# Patient Record
Sex: Female | Born: 1965 | Race: White | Hispanic: No | Marital: Married | Smoking: Former smoker
Health system: Southern US, Community
[De-identification: ages and names within clinical notes are randomized; demographics above are authoritative.]

## PROBLEM LIST (undated history)

## (undated) DIAGNOSIS — M1990 Unspecified osteoarthritis, unspecified site: Secondary | ICD-10-CM

## (undated) HISTORY — PX: CHOLECYSTECTOMY: SHX55

---

## 2012-02-14 ENCOUNTER — Emergency Department (HOSPITAL_COMMUNITY): Payer: BC Managed Care – PPO

## 2012-02-14 ENCOUNTER — Encounter (HOSPITAL_COMMUNITY): Payer: Self-pay | Admitting: Emergency Medicine

## 2012-02-14 ENCOUNTER — Inpatient Hospital Stay (HOSPITAL_COMMUNITY)
Admission: EM | Admit: 2012-02-14 | Discharge: 2012-02-17 | DRG: 813 | Disposition: A | Discharge status: Home / Self Care | Payer: BC Managed Care – PPO | Attending: Internal Medicine | Admitting: Internal Medicine

## 2012-02-14 DIAGNOSIS — E559 Vitamin D deficiency, unspecified: Secondary | ICD-10-CM | POA: Diagnosis present

## 2012-02-14 DIAGNOSIS — E860 Dehydration: Secondary | ICD-10-CM

## 2012-02-14 DIAGNOSIS — E871 Hypo-osmolality and hyponatremia: Secondary | ICD-10-CM | POA: Diagnosis present

## 2012-02-14 DIAGNOSIS — R197 Diarrhea, unspecified: Secondary | ICD-10-CM | POA: Diagnosis present

## 2012-02-14 DIAGNOSIS — R112 Nausea with vomiting, unspecified: Secondary | ICD-10-CM

## 2012-02-14 DIAGNOSIS — E876 Hypokalemia: Secondary | ICD-10-CM | POA: Diagnosis present

## 2012-02-14 DIAGNOSIS — K219 Gastro-esophageal reflux disease without esophagitis: Secondary | ICD-10-CM | POA: Diagnosis present

## 2012-02-14 DIAGNOSIS — IMO0002 Reserved for concepts with insufficient information to code with codable children: Secondary | ICD-10-CM

## 2012-02-14 DIAGNOSIS — M069 Rheumatoid arthritis, unspecified: Secondary | ICD-10-CM | POA: Diagnosis present

## 2012-02-14 DIAGNOSIS — Z79899 Other long term (current) drug therapy: Secondary | ICD-10-CM

## 2012-02-14 DIAGNOSIS — A088 Other specified intestinal infections: Principal | ICD-10-CM | POA: Diagnosis present

## 2012-02-14 DIAGNOSIS — K529 Noninfective gastroenteritis and colitis, unspecified: Secondary | ICD-10-CM | POA: Diagnosis present

## 2012-02-14 DIAGNOSIS — N390 Urinary tract infection, site not specified: Secondary | ICD-10-CM | POA: Diagnosis present

## 2012-02-14 HISTORY — DX: Unspecified osteoarthritis, unspecified site: M19.90

## 2012-02-14 LAB — LACTIC ACID, PLASMA: Lactic Acid, Venous: 1.1 mmol/L (ref 0.5–2.2)

## 2012-02-14 LAB — COMPREHENSIVE METABOLIC PANEL
ALT: 21 U/L (ref 0–35)
AST: 31 U/L (ref 0–37)
CO2: 22 mEq/L (ref 19–32)
Chloride: 94 mEq/L — ABNORMAL LOW (ref 96–112)
GFR calc non Af Amer: >90 mL/min (ref >90)
Potassium: 3.1 mEq/L — ABNORMAL LOW (ref 3.5–5.1)
Sodium: 134 mEq/L — ABNORMAL LOW (ref 135–145)
Total Bilirubin: 0.6 mg/dL (ref 0.3–1.2)

## 2012-02-14 LAB — CBC WITH DIFFERENTIAL/PLATELET
Basophils Absolute: 0.1 10*3/uL (ref 0.0–0.1)
HCT: 47.9 % — ABNORMAL HIGH (ref 36.0–46.0)
Lymphocytes Relative: 15 % (ref 12–46)
Neutro Abs: 4.3 10*3/uL (ref 1.7–7.7)
Neutrophils Relative %: 65 % (ref 43–77)
Platelets: 205 10*3/uL (ref 150–400)
RDW: 14.2 % (ref 11.5–15.5)
WBC: 6.6 10*3/uL (ref 4.0–10.5)

## 2012-02-14 LAB — URINALYSIS, ROUTINE W REFLEX MICROSCOPIC
Ketones, ur: >80 mg/dL — AB
Leukocytes, UA: NEGATIVE
Nitrite: NEGATIVE
Specific Gravity, Urine: >1.03 — ABNORMAL HIGH (ref 1.005–1.030)
Urobilinogen, UA: 0.2 mg/dL (ref 0.0–1.0)
pH: 6 (ref 5.0–8.0)

## 2012-02-14 LAB — URINE MICROSCOPIC-ADD ON

## 2012-02-14 LAB — CLOSTRIDIUM DIFFICILE BY PCR: Toxigenic C. Difficile by PCR: NEGATIVE

## 2012-02-14 MED ORDER — METRONIDAZOLE IN NACL 5-0.79 MG/ML-% IV SOLN
500.0000 mg | Freq: Three times a day (TID) | INTRAVENOUS | Status: DC
  Administered 2012-02-15 (×2): 500 mg via INTRAVENOUS
  Filled 2012-02-14 (×3): qty 100

## 2012-02-14 MED ORDER — POTASSIUM CHLORIDE 10 MEQ/100ML IV SOLN
10.0000 meq | Freq: Once | INTRAVENOUS | Status: AC
  Administered 2012-02-14: 10 meq via INTRAVENOUS
  Filled 2012-02-14: qty 100

## 2012-02-14 MED ORDER — METRONIDAZOLE IN NACL 5-0.79 MG/ML-% IV SOLN
INTRAVENOUS | Status: AC
  Filled 2012-02-14: qty 100

## 2012-02-14 MED ORDER — POTASSIUM CHLORIDE 10 MEQ/100ML IV SOLN
10.0000 meq | INTRAVENOUS | Status: AC
  Administered 2012-02-15 (×2): 10 meq via INTRAVENOUS
  Filled 2012-02-14 (×2): qty 100

## 2012-02-14 MED ORDER — FAMOTIDINE IN NACL 20-0.9 MG/50ML-% IV SOLN
20.0000 mg | Freq: Once | INTRAVENOUS | Status: AC
  Administered 2012-02-14: 20 mg via INTRAVENOUS
  Filled 2012-02-14: qty 50

## 2012-02-14 MED ORDER — IOHEXOL 300 MG/ML  SOLN
100.0000 mL | Freq: Once | INTRAMUSCULAR | Status: AC | PRN
  Administered 2012-02-14: 100 mL via INTRAVENOUS

## 2012-02-14 MED ORDER — SODIUM CHLORIDE 0.9 % IV SOLN
INTRAVENOUS | Status: DC
  Administered 2012-02-14: 20:00:00 via INTRAVENOUS

## 2012-02-14 MED ORDER — ONDANSETRON HCL 4 MG/2ML IJ SOLN
4.0000 mg | INTRAMUSCULAR | Status: AC | PRN
  Administered 2012-02-14 (×2): 4 mg via INTRAVENOUS
  Filled 2012-02-14 (×2): qty 2

## 2012-02-14 MED ORDER — SODIUM CHLORIDE 0.9 % IV BOLUS (SEPSIS)
1000.0000 mL | Freq: Once | INTRAVENOUS | Status: AC
  Administered 2012-02-14: 1000 mL via INTRAVENOUS

## 2012-02-14 MED ORDER — CIPROFLOXACIN IN D5W 400 MG/200ML IV SOLN
400.0000 mg | Freq: Two times a day (BID) | INTRAVENOUS | Status: DC
  Administered 2012-02-14: 400 mg via INTRAVENOUS
  Filled 2012-02-14 (×2): qty 200

## 2012-02-14 NOTE — ED Provider Notes (Signed)
History     CSN: [id number]  Arrival date & time 02/14/12  1709   First MD Initiated Contact with Patient 02/14/12 1722      Chief Complaint  Patient presents with  . Weakness  . Hypotension     HPI Pt was seen at 1735.  Per pt and spouse, c/o gradual onset and persistence of multiple intermittent episodes of N/V/D for the past 4-5 days.  Has been associated with generalized abd "pain."  State Tmax was "100.5" at home. Pt states she had a brief syncopal episode after using the bathroom at home 4 days ago. Pt was eval in [facility] ED after syncopal episode, told her "BP was low" and she "had a GI virus" that would "get better in a day or 2."  Pt states her symptoms continue.   Pt endorses multiple sick contacts at pt's work with same symptoms.  Denies black or blood in stools or emesis, no back pain, no rash, no fevers, no CP/SOB.    Past Medical History  Diagnosis Date  . Arthritis     Past Surgical History  Procedure Date  . Cesarean section     History  Substance Use Topics  . Smoking status: Not on file  . Smokeless tobacco: Not on file  . Alcohol Use: No      Review of Systems ROS: Statement: All systems negative except as marked or noted in the HPI; Constitutional: +chills, decreased PO intake. ; ; Eyes: Negative for eye pain, redness and discharge. ; ; ENMT: Negative for ear pain, hoarseness, nasal congestion, sinus pressure and sore throat. ; ; Cardiovascular: Negative for chest pain, palpitations, diaphoresis, dyspnea and peripheral edema. ; ; Respiratory: Negative for cough, wheezing and stridor. ; ; Gastrointestinal: +N/V/D, abd pain. Negative for blood in stool, hematemesis, jaundice and rectal bleeding. . ; ; Genitourinary: Negative for dysuria, flank pain and hematuria. ; ; Musculoskeletal: Negative for back pain and neck pain. Negative for swelling and trauma.; ; Skin: Negative for pruritus, rash, abrasions, blisters, bruising and skin lesion.; ; Neuro: Negative  for headache, lightheadedness and neck stiffness. Negative for altered mental status, extremity weakness, paresthesias, involuntary movement, seizure and +generalized weakness, syncope.       Allergies  Amoxicillin; Ceclor; and Codeine  Home Medications   Current Outpatient Rx  Name  Route  Sig  Dispense  Refill  . VITAMIN D PO   Oral   Take 1 tablet by mouth daily.         Marland Kitchen FOLIC ACID 1 MG PO TABS   Oral   Take 1 mg by mouth daily.         Marland Kitchen METHOTREXATE 2.5 MG PO TABS   Oral   Take 12.5 mg by mouth once a week. Caution:Chemotherapy. Protect from light.         Marland Kitchen METRONIDAZOLE 500 MG PO TABS   Oral   Take 500 mg by mouth 2 (two) times daily.         Marland Kitchen OMEPRAZOLE 40 MG PO CPDR   Oral   Take 40 mg by mouth daily.         Marland Kitchen PREDNISONE 10 MG PO TABS   Oral   Take 10 mg by mouth every morning.         Marland Kitchen VITAMIN D (ERGOCALCIFEROL) 50000 UNITS PO CAPS   Oral   Take 50,000 Units by mouth every 7 (seven) days.           BP  121/76  Pulse 108  Temp 98.1 F (36.7 C) (Oral)  Resp 23  Ht 5\' 3"  (1.6 m)  Wt 180 lb (81.647 kg)  BMI 31.89 kg/m2  SpO2 99%  Physical Exam 1740: Physical examination:  Nursing notes reviewed; Vital signs and O2 SAT reviewed;  Constitutional: Well developed, Well nourished, In no acute distress; Head:  Normocephalic, atraumatic; Eyes: EOMI, PERRL, No scleral icterus; ENMT: Mouth and pharynx normal, Mucous membranes dry; Neck: Supple, Full range of motion, No lymphadenopathy; Cardiovascular: Regular rate and rhythm, No gallop; Respiratory: Breath sounds clear & equal bilaterally, No rales, rhonchi, wheezes.  Speaking full sentences with ease, Normal respiratory effort/excursion; Chest: Nontender, Movement normal; Abdomen: Soft, +diffusely tender to palp, esp mid-epigastric area. Nondistended, Normal bowel sounds;; Extremities: Pulses normal, No tenderness, No edema, No calf edema or asymmetry.; Neuro: AA&Ox3, Major CN grossly intact.   Speech clear. No gross focal motor or sensory deficits in extremities.; Skin: Color normal, Warm, Dry.   ED Course  Procedures    MDM  MDM Reviewed: previous chart, nursing note and vitals Interpretation: labs, ECG, x-ray and CT scan    Date: 02/14/2012  Rate: 89  Rhythm: normal sinus rhythm  QRS Axis: normal  Intervals: normal  ST/T Wave abnormalities: nonspecific ST/T changes inferior leads  Conduction Disutrbances:none  Narrative Interpretation:   Old EKG Reviewed: none available.    Results for orders placed during the hospital encounter of 02/14/12  LACTIC ACID, PLASMA      Component Value Range   Lactic Acid, Venous 1.1  0.5 - 2.2 mmol/L  CBC WITH DIFFERENTIAL      Component Value Range   WBC 6.6  4.0 - 10.5 K/uL   RBC 5.69 (*) 3.87 - 5.11 MIL/uL   Hemoglobin 16.8 (*) 12.0 - 15.0 g/dL   HCT 40.3 (*) 47.4 - 25.9 %   MCV 84.2  78.0 - 100.0 fL   MCH 29.5  26.0 - 34.0 pg   MCHC 35.1  30.0 - 36.0 g/dL   RDW 56.3  87.5 - 64.3 %   Platelets 205  150 - 400 K/uL   Neutrophils Relative 65  43 - 77 %   Neutro Abs 4.3  1.7 - 7.7 K/uL   Lymphocytes Relative 15  12 - 46 %   Lymphs Abs 1.0  0.7 - 4.0 K/uL   Monocytes Relative 15 (*) 3 - 12 %   Monocytes Absolute 1.0  0.1 - 1.0 K/uL   Eosinophils Relative 3  0 - 5 %   Eosinophils Absolute 0.2  0.0 - 0.7 K/uL   Basophils Relative 2 (*) 0 - 1 %   Basophils Absolute 0.1  0.0 - 0.1 K/uL  COMPREHENSIVE METABOLIC PANEL      Component Value Range   Sodium 134 (*) 135 - 145 mEq/L   Potassium 3.1 (*) 3.5 - 5.1 mEq/L   Chloride 94 (*) 96 - 112 mEq/L   CO2 22  19 - 32 mEq/L   Glucose, Bld 84  70 - 99 mg/dL   BUN 8  6 - 23 mg/dL   Creatinine, Ser 3.29  0.50 - 1.10 mg/dL   Calcium 9.3  8.4 - 51.8 mg/dL   Total Protein 7.1  6.0 - 8.3 g/dL   Albumin 3.4 (*) 3.5 - 5.2 g/dL   AST 31  0 - 37 U/L   ALT 21  0 - 35 U/L   Alkaline Phosphatase 53  39 - 117 U/L   Total Bilirubin 0.6  0.3 - 1.2 mg/dL   GFR calc non Af Amer >90  >90  mL/min   GFR calc Af Amer >90  >90 mL/min  LIPASE, BLOOD      Component Value Range   Lipase 79 (*) 11 - 59 U/L  URINALYSIS, ROUTINE W REFLEX MICROSCOPIC      Component Value Range   Color, Urine YELLOW  YELLOW   APPearance CLEAR  CLEAR   Specific Gravity, Urine >1.030 (*) 1.005 - 1.030   pH 6.0  5.0 - 8.0   Glucose, UA NEGATIVE  NEGATIVE mg/dL   Hgb urine dipstick SMALL (*) NEGATIVE   Bilirubin Urine SMALL (*) NEGATIVE   Ketones, ur >80 (*) NEGATIVE mg/dL   Protein, ur TRACE (*) NEGATIVE mg/dL   Urobilinogen, UA 0.2  0.0 - 1.0 mg/dL   Nitrite NEGATIVE  NEGATIVE   Leukocytes, UA NEGATIVE  NEGATIVE  PREGNANCY, URINE      Component Value Range   Preg Test, Ur NEGATIVE  NEGATIVE  TROPONIN I      Component Value Range   Troponin I <0.30  <0.30 ng/mL  URINE MICROSCOPIC-ADD ON      Component Value Range   Squamous Epithelial / LPF MANY (*) RARE   WBC, UA 11-20  <3 WBC/hpf   RBC / HPF 0-2  <3 RBC/hpf   Bacteria, UA MANY (*) RARE   Urine-Other MUCOUS PRESENT     Dg Chest 2 View 02/14/2012  *RADIOLOGY REPORT*  Clinical Data: Cough.  CHEST - 2 VIEW  Comparison: No priors.  Findings: Lung volumes are normal.  No consolidative airspace disease.  No pleural effusions.  No pneumothorax.  No pulmonary nodule or mass noted.  Pulmonary vasculature and the cardiomediastinal silhouette are within normal limits.  IMPRESSION: 1. No radiographic evidence of acute cardiopulmonary disease.   Original Report Authenticated By: Trudie Reed, M.D.    Ct Abdomen Pelvis W Contrast 02/14/2012  *RADIOLOGY REPORT*  Clinical Data: Nausea, vomiting and diarrhea.  CT ABDOMEN AND PELVIS WITH CONTRAST  Technique:  Multidetector CT imaging of the abdomen and pelvis was performed following the standard protocol during bolus administration of intravenous contrast.  Contrast: OMNIPAQUE IOHEXOL 300 MG/ML  SOLN  Comparison: No priors.  Findings:  Lung Bases: Small hiatal hernia.  Otherwise, unremarkable.   Abdomen/Pelvis:  The enhanced appearance of the liver, gallbladder, pancreas, spleen, bilateral adrenal glands and bilateral kidneys is unremarkable.  No ascites or pneumoperitoneum and no pathologic distension of small bowel.  No definite pathologic lymphadenopathy identified.  Uterus and bilateral ovaries are unremarkable in appearance.  Urinary bladder is nearly complete decompressed and unremarkable in appearance.  Musculoskeletal: There are no aggressive appearing lytic or blastic lesions noted in the visualized portions of the skeleton.  IMPRESSION: 1.  No acute abnormalities in the abdomen or pelvis to account for the patient's symptoms. 2.  Small hiatal hernia.   Original Report Authenticated By: Trudie Reed, M.D.       2020:  Pt with multiple diarrheal stools while in the ED. Fecal occult blood, cdiff and stool cultures obtained and sent to lab.  No N/V while in the ED.  Clinically appears dehydrated, labs hemeconcentrated.  Potassium repleted IV.  Udip contaminated, UC pending.  Dx and testing d/w pt and family.  Questions answered.  Verb understanding, agreeable to admit.  T/C to [facility] Dr. Phillips Odor, case discussed, including:  HPI, pertinent PM/SHx, VS/PE, dx testing, ED course and treatment:  Agreeable to observation admit, requests she will  come to ED for eval.        Laray Anger, DO 02/15/12 1629

## 2012-02-14 NOTE — ED Notes (Signed)
Pt c/o being weak and having low blood pressure since syncopal episode Friday. Pt has lost control of bowels and is vomiting intermittently.

## 2012-02-15 DIAGNOSIS — E876 Hypokalemia: Secondary | ICD-10-CM

## 2012-02-15 DIAGNOSIS — M069 Rheumatoid arthritis, unspecified: Secondary | ICD-10-CM

## 2012-02-15 DIAGNOSIS — E860 Dehydration: Secondary | ICD-10-CM

## 2012-02-15 DIAGNOSIS — R112 Nausea with vomiting, unspecified: Secondary | ICD-10-CM

## 2012-02-15 LAB — CBC
HCT: 37.9 % (ref 36.0–46.0)
Platelets: 187 10*3/uL (ref 150–400)
RDW: 14.3 % (ref 11.5–15.5)
WBC: 5.7 10*3/uL (ref 4.0–10.5)

## 2012-02-15 LAB — BASIC METABOLIC PANEL
Calcium: 7.8 mg/dL — ABNORMAL LOW (ref 8.4–10.5)
Chloride: 102 mEq/L (ref 96–112)
Creatinine, Ser: 0.72 mg/dL (ref 0.50–1.10)
GFR calc Af Amer: >90 mL/min (ref >90)
Sodium: 136 mEq/L (ref 135–145)

## 2012-02-15 MED ORDER — CIPROFLOXACIN IN D5W 400 MG/200ML IV SOLN
400.0000 mg | Freq: Two times a day (BID) | INTRAVENOUS | Status: DC
  Administered 2012-02-15 – 2012-02-16 (×3): 400 mg via INTRAVENOUS
  Filled 2012-02-15 (×5): qty 200

## 2012-02-15 MED ORDER — TRAMADOL HCL 50 MG PO TABS
50.0000 mg | ORAL_TABLET | Freq: Four times a day (QID) | ORAL | Status: DC | PRN
  Administered 2012-02-15: 50 mg via ORAL
  Filled 2012-02-15: qty 1

## 2012-02-15 MED ORDER — POTASSIUM CHLORIDE 10 MEQ/100ML IV SOLN
INTRAVENOUS | Status: AC
  Filled 2012-02-15: qty 100

## 2012-02-15 MED ORDER — MORPHINE SULFATE 2 MG/ML IJ SOLN
2.0000 mg | INTRAMUSCULAR | Status: DC | PRN
  Administered 2012-02-15: 2 mg via INTRAVENOUS
  Filled 2012-02-15: qty 1

## 2012-02-15 MED ORDER — ONDANSETRON HCL 4 MG/2ML IJ SOLN
INTRAMUSCULAR | Status: AC
  Administered 2012-02-16: 4 mg via INTRAVENOUS
  Filled 2012-02-15: qty 2

## 2012-02-15 MED ORDER — ONDANSETRON HCL 4 MG PO TABS: 4.0000 mg | ORAL_TABLET | Freq: Four times a day (QID) | ORAL | Status: DC | PRN

## 2012-02-15 MED ORDER — BIOTENE DRY MOUTH MT LIQD
15.0000 mL | Freq: Two times a day (BID) | OROMUCOSAL | Status: DC
  Administered 2012-02-15 – 2012-02-17 (×5): 15 mL via OROMUCOSAL

## 2012-02-15 MED ORDER — POTASSIUM CHLORIDE 10 MEQ/100ML IV SOLN
10.0000 meq | Freq: Once | INTRAVENOUS | Status: AC
  Administered 2012-02-15: 10 meq via INTRAVENOUS
  Filled 2012-02-15: qty 100

## 2012-02-15 MED ORDER — ONDANSETRON HCL 4 MG/2ML IJ SOLN
4.0000 mg | Freq: Four times a day (QID) | INTRAMUSCULAR | Status: DC | PRN
  Administered 2012-02-15 – 2012-02-16 (×2): 4 mg via INTRAVENOUS
  Filled 2012-02-15: qty 2

## 2012-02-15 MED ORDER — POTASSIUM CHLORIDE IN NACL 40-0.9 MEQ/L-% IV SOLN
INTRAVENOUS | Status: DC
  Administered 2012-02-15 – 2012-02-17 (×4): via INTRAVENOUS

## 2012-02-15 NOTE — H&P (Signed)
[facility] History and Physical  Sabrina Jacobson ZOX:[id number] DOB: 12/31/1965 DOA: 02/14/2012  Referring physician: Marice Potter PCP: No primary provider on file.  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 Her illness started 5 days ago in the morning with profuse vomiting and simultaneous watery diarrhea, it persisted throughout the day and she had a witnessed syncopal episode, "fell like a tree" per husband, had LOC for about 6 seconds, EMS was called and they took her to [facility] ED where she was given IV fluids and then sent home with some percocet (even though she was vomiting) for her rib pain from falling and told she would improve in 24 hours. Per patient the reported her lab work was all normal. Her symptoms however persisted -she has had no PO intake for the past 4-5 days, she feels hungry but has been unable to keep down bland food or fluids, her diarrhea also continues. She has had no know exposures to raw foods or eaten anything different than usual. Her son who is 17 and a type 1 diabetic had 2-3 days of diarrhea about a week and a half ago, her husband and daughter have not had any GI illness. No recent antibiotics. No inflammatory bowel disease or medical history, she thinks she may have had a fever when the symptoms started, no chills, no night sweats, no Chest pain or hematemesis. No significant cramping or abdominal pain associated with NVD.  In ED she is clinically dehydrated with hyponatremia, hemeconcentration, and high SG w/ketones. Her K is 3.1. CT of ABD was negative. Patient is being admitted for IV fuids and evaluation of persistent NVD.   Review of Systems  Constitutional: Positive for fever, weight loss and malaise/fatigue.  Negative for chills and diaphoresis.  HENT: Negative for congestion and sore throat.   Eyes: Negative.   Respiratory: Negative.   Cardiovascular: Negative.   Gastrointestinal: Positive for nausea, vomiting and diarrhea. Negative for heartburn, abdominal pain, constipation, blood in stool and melena.  Genitourinary: Negative.   Musculoskeletal: Positive for joint pain.  Skin: Negative for itching and rash.  Neurological: Positive for dizziness, loss of consciousness and weakness. Negative for headaches.  Endo/Heme/Allergies: Negative.   Psychiatric/Behavioral: Negative for depression and substance abuse. The patient is not nervous/anxious.   All other systems reviewed and are negative.   Past Medical History  Diagnosis Date  . Arthritis    Past Surgical History  Procedure Date  . Cesarean section    Social History:  does not have a smoking history on file. She does not have any smokeless tobacco history on file. She reports that she does not drink alcohol or use illicit drugs. Lives in [place], [place] with her husband and her teenage son and daughter. She is a high Chief Strategy Officer.   Allergies  Allergen Reactions  . Amoxicillin Hives and Other (See Comments)    Reaction unknown: Possible hives to either this medication or codeine  . Ceclor (Cefaclor) Hives    Possible Reaction of hives   . Codeine Nausea And Vomiting    No family history on file. Mother with alzheimer's disease, father deceased of stroke.  Prior to Admission medications   Medication Sig Start Date End Date Taking? Authorizing Provider  Cholecalciferol (VITAMIN D PO) Take 1 tablet  by mouth daily.   Yes Historical Provider, MD  folic acid (FOLVITE) 1 MG tablet Take 1 mg by mouth daily.   Yes Historical Provider, MD  methotrexate (RHEUMATREX) 2.5 MG tablet Take 12.5 mg by mouth once a week. Caution:Chemotherapy. Protect from light.   Yes Historical Provider, MD  metroNIDAZOLE (FLAGYL) 500 MG tablet Take 500  mg by mouth 2 (two) times daily.   Yes Historical Provider, MD  omeprazole (PRILOSEC) 40 MG capsule Take 40 mg by mouth daily.   Yes Historical Provider, MD  predniSONE (DELTASONE) 10 MG tablet Take 10 mg by mouth every morning.   Yes Historical Provider, MD  Vitamin D, Ergocalciferol, (DRISDOL) 50000 UNITS CAPS Take 50,000 Units by mouth every 7 (seven) days.   Yes Historical Provider, MD   Physical Exam: Filed Vitals:   02/14/12 2000 02/14/12 2100 02/14/12 2200 02/14/12 2237  BP: 111/71 108/70 113/69 119/81  Pulse: 91  82 89  Temp:    98.2 F (36.8 C)  TempSrc:    Oral  Resp: 17 15 16 16   Height:    5\' 3"  (1.6 m)  Weight:      SpO2: 96%  98% 94%     General:  Pale, sick appearing woman, dark circles under her eyes, looks exhausted  Eyes: non icteric  ENT: very dry MM  Neck: normal  Cardiovascular: Tachy, regular, no mrg  Respiratory: CTAB  Abdomen: soft, non-tender, hyperactive BS  Skin: no rashes, dry, loss of turgor  Musculoskeletal: no active joint swelling or deformity  Psychiatric: appropriate  Neurologic: normal, non-focal  Labs on Admission:  Basic Metabolic Panel:  Lab 02/14/12 4098  NA 134*  K 3.1*  CL 94*  CO2 22  GLUCOSE 84  BUN 8  CREATININE 0.63  CALCIUM 9.3  MG --  PHOS --   Liver Function Tests:  Lab 02/14/12 1730  AST 31  ALT 21  ALKPHOS 53  BILITOT 0.6  PROT 7.1  ALBUMIN 3.4*    Lab 02/14/12 1730  LIPASE 79*  AMYLASE --   CBC:  Lab 02/14/12 1730  WBC 6.6  NEUTROABS 4.3  HGB 16.8*  HCT 47.9*  MCV 84.2  PLT 205   Cardiac Enzymes:  Lab 02/14/12 1730  CKTOTAL --  CKMB --  CKMBINDEX --  TROPONINI <0.30    Radiological Exams on Admission: Dg Chest 2 View  02/14/2012  *RADIOLOGY REPORT*  Clinical Data: Cough.  CHEST - 2 VIEW  Comparison: No priors.  Findings: Lung volumes are normal.  No consolidative airspace disease.  No pleural effusions.  No pneumothorax.  No pulmonary nodule or mass noted.  Pulmonary  vasculature and the cardiomediastinal silhouette are within normal limits.  IMPRESSION: 1. No radiographic evidence of acute cardiopulmonary disease.   Original Report Authenticated By: Trudie Reed, M.D.    Ct Abdomen Pelvis W Contrast  02/14/2012  *RADIOLOGY REPORT*  Clinical Data: Nausea, vomiting and diarrhea.  CT ABDOMEN AND PELVIS WITH CONTRAST  Technique:  Multidetector CT imaging of the abdomen and pelvis was performed following the standard protocol during bolus administration of intravenous contrast.  Contrast: OMNIPAQUE IOHEXOL 300 MG/ML  SOLN  Comparison: No priors.  Findings:  Lung Bases: Small hiatal hernia.  Otherwise, unremarkable.  Abdomen/Pelvis:  The enhanced appearance of the liver, gallbladder, pancreas, spleen, bilateral adrenal glands and bilateral kidneys is unremarkable.  No ascites or pneumoperitoneum and no pathologic distension of small bowel.  No definite pathologic lymphadenopathy identified.  Uterus and bilateral ovaries are  unremarkable in appearance.  Urinary bladder is nearly complete decompressed and unremarkable in appearance.  Musculoskeletal: There are no aggressive appearing lytic or blastic lesions noted in the visualized portions of the skeleton.  IMPRESSION: 1.  No acute abnormalities in the abdomen or pelvis to account for the patient's symptoms. 2.  Small hiatal hernia.   Original Report Authenticated By: Trudie Reed, M.D.     EKG: Independently reviewed. NSR.Sinus Tachycardia.  Assessment/Plan Principal Problem:  *Dehydration Active Problems:  Nausea & vomiting  Diarrhea  Rheumatoid arthritis  Vitamin D deficiency  GERD (gastroesophageal reflux disease)  UTI (lower urinary tract infection)    Ms. Liggett comes in after 5 days of a persistent acute gastrointestinal illness with nausea, vomiting and diarrhea that was associated with one syncopal episode felt to be from acute dehydration. She is immunocompromised on Methotrexate and  Prednisone for her RA. She has never had any GI problems or other significant infections or side effects from her RA meds. He story is most consistent with an infectious etiology-probably viral like norovirus but because she is immunocompromised she probably having a long and protracted course fighting off this infection. 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 Fortunately her CT is negative, her lab work suggests dehydration, her urine shows 11-20 RBC so she may have a mild UTI. Her lipase is also very mildly elevated probably from duodenal irritation.   Aggressive IV hydration  She is risk for hypotension from Adrenal Insufficiency since she has been on long term steroids, if needed will start stress dose steroids IV  Stool has been sent for Culture, C. Diff, and for norovirus PCR  Empiric Cipro/Flagy for UTI and until cultures and PCRs return.  K repletion  Repeat labs in AM  Provide supportive care inpatient until symptoms show resolution.  Follow up urine cx  Discontinue Methotrexate indefinitely until she can be seen by her rheumatologist  Code Status: Full Code Family Communication: Discussed plan with patient and her husband Disposition Plan: Home when medically stable   Time spent: 70 minutes  [facility] Pager [phone]  If 7PM-7AM, please contact night-coverage [email or web address] Password [email or web address] 02/15/2012, 1:32 AM

## 2012-02-15 NOTE — Progress Notes (Signed)
UR Chart Review Completed  

## 2012-02-15 NOTE — Care Management Note (Unsigned)
    Page 1 of 1   02/15/2012     11:32:45 AM   CARE MANAGEMENT NOTE 02/15/2012  Patient:  Sabrina Jacobson, Sabrina Jacobson   Account Number:  [id number]  Date Initiated:  02/15/2012  Documentation initiated by:  Rosemary Holms  Subjective/Objective Assessment:   pt admitted from home where she lives with spouse. To return home     Action/Plan:   No HH needs anticipated   Anticipated DC Date:  02/17/2012   Anticipated DC Plan:  HOME/SELF CARE         Choice offered to / List presented to:             Status of service:  In process, will continue to follow Medicare Important Message given?   (If response is "NO", the following Medicare IM given date fields will be blank) Date Medicare IM given:   Date Additional Medicare IM given:    Discharge Disposition:    Per UR Regulation:    If discussed at Long Length of Stay Meetings, dates discussed:    Comments:  02/15/12 Rosemary Holms RN BSN CM

## 2012-02-15 NOTE — Progress Notes (Signed)
02/15/12 1854 Patient c/o pain to her right side this evening, thinks possibly due to her fall prior to admission, stated "think i may have pulled something". Patient had morphine PRN for pain, expressed concerns since she has never taken this. Notified Dr Karilyn Cota, orders received for tramadol 50 mg po every 6 hours PRN for pain. Nursing to monitor. Earnstine Regal, RN

## 2012-02-15 NOTE — Progress Notes (Signed)
Subjective: This lady feels improved today and feel that she has some strength now. She still continues to have watery diarrhea. No rectal bleeding. She is able to tolerate oral intake without vomiting. No abdominal pain.           Physical Exam: Blood pressure 99/62, pulse 74, temperature 99 F (37.2 C), temperature source Oral, resp. rate 16, height 5\' 3"  (1.6 m), weight 85.276 kg (188 lb), SpO2 93.00%. She looks systemically well. She is not toxic or septic. Her abdomen is soft and nontender. Bowel sounds are heard. Heart sounds are present and normal. Lung fields are clear. She is alert and orientated.   Investigations:  Recent Results (from the past 240 hour(s))  CLOSTRIDIUM DIFFICILE BY PCR     Status: Normal   Collection Time   02/14/12  5:52 PM      Component Value Range Status Comment   C difficile by pcr NEGATIVE  NEGATIVE Final      Basic Metabolic Panel:  Basename 02/15/12 0453 02/14/12 1730  NA 136 134*  K 3.1* 3.1*  CL 102 94*  CO2 22 22  GLUCOSE 82 84  BUN 5* 8  CREATININE 0.72 0.63  CALCIUM 7.8* 9.3  MG -- --  PHOS -- --   Liver Function Tests:  [facility] 02/14/12 1730  AST 31  ALT 21  ALKPHOS 53  BILITOT 0.6  PROT 7.1  ALBUMIN 3.4*     CBC:  Basename 02/15/12 0453 02/14/12 1730  WBC 5.7 6.6  NEUTROABS -- 4.3  HGB 12.8 16.8*  HCT 37.9 47.9*  MCV 85.2 84.2  PLT 187 205    Dg Chest 2 View  02/14/2012  *RADIOLOGY REPORT*  Clinical Data: Cough.  CHEST - 2 VIEW  Comparison: No priors.  Findings: Lung volumes are normal.  No consolidative airspace disease.  No pleural effusions.  No pneumothorax.  No pulmonary nodule or mass noted.  Pulmonary vasculature and the cardiomediastinal silhouette are within normal limits.  IMPRESSION: 1. No radiographic evidence of acute cardiopulmonary disease.   Original Report Authenticated By: Trudie Reed, M.D.    Ct Abdomen Pelvis W Contrast  02/14/2012  *RADIOLOGY REPORT*  Clinical Data:  Nausea, vomiting and diarrhea.  CT ABDOMEN AND PELVIS WITH CONTRAST  Technique:  Multidetector CT imaging of the abdomen and pelvis was performed following the standard protocol during bolus administration of intravenous contrast.  Contrast: OMNIPAQUE IOHEXOL 300 MG/ML  SOLN  Comparison: No priors.  Findings:  Lung Bases: Small hiatal hernia.  Otherwise, unremarkable.  Abdomen/Pelvis:  The enhanced appearance of the liver, gallbladder, pancreas, spleen, bilateral adrenal glands and bilateral kidneys is unremarkable.  No ascites or pneumoperitoneum and no pathologic distension of small bowel.  No definite pathologic lymphadenopathy identified.  Uterus and bilateral ovaries are unremarkable in appearance.  Urinary bladder is nearly complete decompressed and unremarkable in appearance.  Musculoskeletal: There are no aggressive appearing lytic or blastic lesions noted in the visualized portions of the skeleton.  IMPRESSION: 1.  No acute abnormalities in the abdomen or pelvis to account for the patient's symptoms. 2.  Small hiatal hernia.   Original Report Authenticated By: Trudie Reed, M.D.       Medications: I have reviewed the patient's current medications.  Impression: 1. Viral gastroenteritis. CT scan of the abdomen and not indicative of anything more sinister. 2. Rheumatoid arthritis on methotrexate. 3. Probable UTI. 4. Hypokalemia.     Plan: 1. Discontinue metronidazole as I do not really  think she has a bacterial gastroenteritis or colitis. 2. Continue with ciprofloxacin for probable UTI. 3. Replete potassium. 4. Reduce IV fluids and encourage oral intake. 5. Home when medically stable.     LOS: 1 day   Wilson Singer Pager [phone]  02/15/2012, 8:57 AM

## 2012-02-16 ENCOUNTER — Encounter (HOSPITAL_COMMUNITY): Payer: Self-pay | Admitting: Internal Medicine

## 2012-02-16 DIAGNOSIS — E876 Hypokalemia: Secondary | ICD-10-CM | POA: Diagnosis present

## 2012-02-16 DIAGNOSIS — K5289 Other specified noninfective gastroenteritis and colitis: Secondary | ICD-10-CM

## 2012-02-16 DIAGNOSIS — K529 Noninfective gastroenteritis and colitis, unspecified: Secondary | ICD-10-CM | POA: Diagnosis present

## 2012-02-16 LAB — COMPREHENSIVE METABOLIC PANEL
ALT: 18 U/L (ref 0–35)
AST: 25 U/L (ref 0–37)
CO2: 20 mEq/L (ref 19–32)
Chloride: 105 mEq/L (ref 96–112)
GFR calc non Af Amer: >90 mL/min (ref >90)
Sodium: 134 mEq/L — ABNORMAL LOW (ref 135–145)
Total Bilirubin: 0.3 mg/dL (ref 0.3–1.2)

## 2012-02-16 LAB — URINE CULTURE: Colony Count: >=100000

## 2012-02-16 LAB — CBC
Platelets: 201 10*3/uL (ref 150–400)
RBC: 4.6 MIL/uL (ref 3.87–5.11)
WBC: 5.8 10*3/uL (ref 4.0–10.5)

## 2012-02-16 MED ORDER — FOLIC ACID 1 MG PO TABS
1.0000 mg | ORAL_TABLET | Freq: Every day | ORAL | Status: DC
  Administered 2012-02-16 – 2012-02-17 (×2): 1 mg via ORAL
  Filled 2012-02-16 (×2): qty 1

## 2012-02-16 MED ORDER — LOPERAMIDE HCL 2 MG PO CAPS
4.0000 mg | ORAL_CAPSULE | Freq: Once | ORAL | Status: AC
  Administered 2012-02-16: 4 mg via ORAL
  Filled 2012-02-16: qty 2
  Filled 2012-02-16: qty 1

## 2012-02-16 MED ORDER — LOPERAMIDE HCL 2 MG PO CAPS
2.0000 mg | ORAL_CAPSULE | ORAL | Status: DC | PRN
  Administered 2012-02-17: 2 mg via ORAL
  Filled 2012-02-16: qty 1

## 2012-02-16 MED ORDER — PREDNISONE 10 MG PO TABS
10.0000 mg | ORAL_TABLET | Freq: Every morning | ORAL | Status: DC
  Administered 2012-02-16 – 2012-02-17 (×2): 10 mg via ORAL
  Filled 2012-02-16 (×2): qty 1

## 2012-02-16 NOTE — Progress Notes (Signed)
Chart reviewed.  Subjective: Some nausea. No vomiting. Still with watery stools, though less frequent. No abdominal pain. No dysuria. Has been tolerating liquid diet. Trying a solid diet for lunch. Objective: Vital signs in last 24 hours: Filed Vitals:   02/15/12 0403 02/15/12 1407 02/15/12 2026 02/16/12 0551  BP: 99/62 107/69 116/72 108/71  Pulse: 74 77 86 79  Temp: 99 F (37.2 C) 98.9 F (37.2 C) 100.6 F (38.1 C) 98.2 F (36.8 C)  TempSrc: Oral  Axillary   Resp: 16 16 16 16   Height:      Weight: 85.276 kg (188 lb)     SpO2: 93% 95% 96% 98%   Weight change:   Intake/Output Summary (Last 24 hours) at 02/16/12 1249 Last data filed at 02/16/12 7829  Gross per 24 hour  Intake 2886.25 ml  Output      0 ml  Net 2886.25 ml   General: Eating lunch tray. Nontoxic appearing. HEENT: No thrush. Moist mucous membranes. Lungs clear to auscultation bilaterally without wheezes rhonchi or rales Cardiovascular regular rate rhythm without murmurs gallops rubs Abdomen soft nontender nondistended Extremities no clubbing cyanosis or edema  Lab Results: Basic Metabolic Panel:  Lab 02/16/12 5621 02/15/12 0453  NA 134* 136  K 3.4* 3.1*  CL 105 102  CO2 20 22  GLUCOSE 91 82  BUN <3* 5*  CREATININE 0.65 0.72  CALCIUM 8.0* 7.8*  MG 1.5 --  PHOS -- --   Liver Function Tests:  Lab 02/16/12 0511 02/14/12 1730  AST 25 31  ALT 18 21  ALKPHOS 37* 53  BILITOT 0.3 0.6  PROT 5.1* 7.1  ALBUMIN 2.5* 3.4*    Lab 02/14/12 1730  LIPASE 79*  AMYLASE --   No results found for this basename: AMMONIA:2 in the last 168 hours CBC:  Lab 02/16/12 0511 02/15/12 0453 02/14/12 1730  WBC 5.8 5.7 --  NEUTROABS -- -- 4.3  HGB 13.3 12.8 --  HCT 39.2 37.9 --  MCV 85.2 85.2 --  PLT 201 187 --   Cardiac Enzymes:  Lab 02/14/12 1730  CKTOTAL --  CKMB --  CKMBINDEX --  TROPONINI <0.30   Urinalysis:  Lab 02/14/12 1751  COLORURINE YELLOW  LABSPEC >1.030*  PHURINE 6.0  GLUCOSEU NEGATIVE    HGBUR SMALL*  BILIRUBINUR SMALL*  KETONESUR >80*  PROTEINUR TRACE*  UROBILINOGEN 0.2  NITRITE NEGATIVE  LEUKOCYTESUR NEGATIVE    Micro Results: Recent Results (from the past 240 hour(s))  URINE CULTURE     Status: Normal (Preliminary result)   Collection Time   02/14/12  5:51 PM      Component Value Range Status Comment   Specimen Description URINE, CLEAN CATCH   Final    Special Requests NONE   Final    Culture  Setup Time 02/14/2012 18:40   Final    Colony Count >=100,000 COLONIES/ML   Final    Culture ESCHERICHIA COLI   Final    Report Status PENDING   Incomplete   CLOSTRIDIUM DIFFICILE BY PCR     Status: Normal   Collection Time   02/14/12  5:52 PM      Component Value Range Status Comment   C difficile by pcr NEGATIVE  NEGATIVE Final    Studies/Results: Dg Chest 2 View  02/14/2012  *RADIOLOGY REPORT*  Clinical Data: Cough.  CHEST - 2 VIEW  Comparison: No priors.  Findings: Lung volumes are normal.  No consolidative airspace disease.  No pleural effusions.  No pneumothorax.  No pulmonary nodule or mass noted.  Pulmonary vasculature and the cardiomediastinal silhouette are within normal limits.  IMPRESSION: 1. No radiographic evidence of acute cardiopulmonary disease.   Original Report Authenticated By: Trudie Reed, M.D.    Ct Abdomen Pelvis W Contrast  02/14/2012  *RADIOLOGY REPORT*  Clinical Data: Nausea, vomiting and diarrhea.  CT ABDOMEN AND PELVIS WITH CONTRAST  Technique:  Multidetector CT imaging of the abdomen and pelvis was performed following the standard protocol during bolus administration of intravenous contrast.  Contrast: OMNIPAQUE IOHEXOL 300 MG/ML  SOLN  Comparison: No priors.  Findings:  Lung Bases: Small hiatal hernia.  Otherwise, unremarkable.  Abdomen/Pelvis:  The enhanced appearance of the liver, gallbladder, pancreas, spleen, bilateral adrenal glands and bilateral kidneys is unremarkable.  No ascites or pneumoperitoneum and no pathologic  distension of small bowel.  No definite pathologic lymphadenopathy identified.  Uterus and bilateral ovaries are unremarkable in appearance.  Urinary bladder is nearly complete decompressed and unremarkable in appearance.  Musculoskeletal: There are no aggressive appearing lytic or blastic lesions noted in the visualized portions of the skeleton.  IMPRESSION: 1.  No acute abnormalities in the abdomen or pelvis to account for the patient's symptoms. 2.  Small hiatal hernia.   Original Report Authenticated By: Trudie Reed, M.D.    Scheduled Meds:   . antiseptic oral rinse  15 mL Mouth Rinse BID  . ciprofloxacin  400 mg Intravenous Q12H  . [EXPIRED] potassium chloride       Continuous Infusions:   . 0.9 % NaCl with KCl 40 mEq / L 125 mL/hr at 02/16/12 0750   PRN Meds:.morphine injection, ondansetron (ZOFRAN) IV, ondansetron, traMADol Assessment/Plan:   *Gastroenteritis, likely viral. Slowly improving. Will add Imodium. Decrease IV fluids. Discontinue telemetry.  Abnormal urinalysis: Urinalysis is contaminated with many epithelial cells. Patient has no urinary symptoms. Cipro may be contributing to her GI upset. Will stop ciprofloxacin. Doubt UTI. Suspect contaminant from the diarrhea.   Rheumatoid arthritis: Resume prednisone   Vitamin D deficiency   GERD (gastroesophageal reflux disease)   Hypokalemia improved.  Increase activity. Hopefully home in a day or 2.   LOS: 2 days   Sabrina Jacobson L 02/16/2012, 12:49 PM

## 2012-02-17 MED ORDER — DIPHENOXYLATE-ATROPINE 2.5-0.025 MG PO TABS: 1.0000 | ORAL_TABLET | Freq: Four times a day (QID) | ORAL | Status: DC | PRN

## 2012-02-17 MED ORDER — DIPHENOXYLATE-ATROPINE 2.5-0.025 MG PO TABS: 2.0000 | ORAL_TABLET | Freq: Once | ORAL | Status: DC

## 2012-02-17 MED ORDER — PROMETHAZINE HCL 12.5 MG PO TABS: 12.5000 mg | ORAL_TABLET | Freq: Four times a day (QID) | ORAL | Status: DC | PRN

## 2012-02-17 MED ORDER — LOPERAMIDE HCL 2 MG PO CAPS
2.0000 mg | ORAL_CAPSULE | Freq: Three times a day (TID) | ORAL | Status: DC
  Administered 2012-02-17 (×2): 2 mg via ORAL
  Filled 2012-02-17 (×2): qty 1

## 2012-02-17 NOTE — Progress Notes (Signed)
[facility] SURGICAL UNIT [id number] [place] [address] Phone: [phone] Fax: [phone]  February 17, 2012  Patient: Sabrina Jacobson  Date of Birth: [birth year]  Date of Visit: 02/14/2012    To Whom It May Concern:  Shanitra Teller has been ill and incapacitated since 12/22.  She was seen and treated in our emergency department on 02/14/2012. Naliya Kisler  may return to work on 02/27/12.  Sincerely,     Crista Curb, M.D.

## 2012-02-17 NOTE — Progress Notes (Deleted)
Physician Discharge Summary   Patient ID:  Sabrina Jacobson  MRN: [id number]  DOB/AGE: 10/24/1965 46 y.o.  Admit date: 02/14/2012  Discharge date: 02/17/2012  Discharge Diagnoses:  Principal Problem:  *Gastroenteritis  Active Problems:  Rheumatoid arthritis  Vitamin D deficiency  GERD (gastroesophageal reflux disease)  Hypokalemia  Dehydration    Medication List      As of 02/17/2012 1:18 PM     STOP taking these medications        methotrexate 2.5 MG tablet     Commonly known as: RHEUMATREX     metroNIDAZOLE 500 MG tablet     Commonly known as: FLAGYL     TAKE these medications        diphenoxylate-atropine 2.5-0.025 MG per tablet     Commonly known as: LOMOTIL     Take 1-2 tablets by mouth 4 (four) times daily as needed for diarrhea or loose stools.     folic acid 1 MG tablet     Commonly known as: FOLVITE     Take 1 mg by mouth daily.     omeprazole 40 MG capsule     Commonly known as: PRILOSEC     Take 40 mg by mouth daily.     predniSONE 10 MG tablet     Commonly known as: DELTASONE     Take 10 mg by mouth every morning.     promethazine 12.5 MG tablet     Commonly known as: PHENERGAN     Take 1 tablet (12.5 mg total) by mouth every 6 (six) hours as needed for nausea.     Vitamin D (Ergocalciferol) 50000 UNITS Caps     Commonly known as: DRISDOL     Take 50,000 Units by mouth every 7 (seven) days.     VITAMIN D PO     Take 1 tablet by mouth daily.          Discharge Orders    Future Orders  Please Complete By  Expires    Diet general      Increase activity slowly      Discharge instructions      Comments:    Hold methotrexate until diarrhea resolves completely. Drink plenty of liquids.      Disposition: home  Discharged Condition: Stable  Consults: none  Labs:   Sodium        136  134      Potassium        3.1  3.4      Chloride        102  105      CO2        22  20      BUN        5  <3      Creatinine, Ser        0.72  0.65      Calcium         7.8  8.0      GFR calc non Af Amer        90 mL/min">90  90 mL/min">90      GFR calc Af Amer        90 mL/min The eGFR has been calculated using the CKD EPI equation. This calculation has not been validated in all clinical situations. eGFR's persistently <90 mL/min signify possible Chronic Kidney Disease.">9090 mL/min The eGFR has been calculated using the CKD EPI equation. This calculation has not been validated in all clinical situations.   eGFR's persistently <90 mL/min signify possible Chronic Kidney Disease." border=0 src="file:///C:/PROGRAM%20FILES%20(X86)/EPICSYS/V7.8/EN-US/Images/IP_COMMENT_EXIST.gif" width=5 height=10  90 mL/min The eGFR has been calculated using the CKD EPI equation. This calculation has not been validated in all clinical situations. eGFR's persistently <90 mL/min signify possible Chronic Kidney Disease.">9090 mL/min The eGFR has been calculated using the CKD EPI equation. This calculation has not been validated in all clinical situations. eGFR's persistently <90 mL/min signify possible Chronic Kidney Disease." border=0 src="file:///C:/PROGRAM%20FILES%20(X86)/EPICSYS/V7.8/EN-US/Images/IP_COMMENT_EXIST.gif" width=5 height=10      Glucose, Bld        82  91      Magnesium         1.5      Alkaline Phosphatase        53  37      Albumin        3.4  2.5      Lipase        79       AST        31  25      ALT        21  18      Total Protein        7.1  5.1      Total Bilirubin        0.6  0.3       CARDIAC PROFILE     Troponin I        <0.30        OTHER CHEM     Lactic Acid, Venous        1.1        CBC     WBC        5.7  5.8      RBC        4.45  4.60      Hemoglobin        12.8  13.3      HCT        37.9  39.2      MCV        85.2  85.2      MCH        28.8  28.9      MCHC        33.8  33.9      RDW        14.3  14.6      Platelets        187  201       DIFFERENTIAL     Neutrophils Relative        65       Lymphocytes Relative        15        Monocytes Relative        15       Eosinophils Relative        3       Basophils Relative        2       Neutro Abs        4.3       Lymphs Abs        1.0       Monocytes Absolute        1.0       Eosinophils Absolute        0.2       Basophils Absolute        0.1        DIABETES       Glucose, Bld        82  91       GONADAL     Preg Test, Ur        24 mIU/mL"NEGATIVE 24 mIU/mL" border=0 src="file:///C:/PROGRAM%20FILES%20(X86)/EPICSYS/V7.8/EN-US/Images/IP_COMMENT_EXIST.gif" width=5 height=10        URINALYSIS     Color, Urine        YELLOW       APPearance        CLEAR       Specific Gravity, Urine        1.030">1.030       pH        6.0       Glucose, UA        NEGATIVE       Bilirubin Urine        SMALL       Ketones, ur        80 mg/dL">80       Protein, ur        TRACE       Urobilinogen, UA        0.2       Nitrite        NEGATIVE       Leukocytes, UA        NEGATIVE       Hgb urine dipstick        SMALL       Urine-Other        MUCOUS PRESENT       WBC, UA        11-20       RBC / HPF        0-2       Squamous Epithelial / LPF        MANY       Bacteria, UA        MANY        URINE CHEMISTRY     Preg Test, Ur        24 mIU/mL"NEGATIVE 24 mIU/mL" border=0 src="file:///C:/PROGRAM%20FILES%20(X86)/EPICSYS/V7.8/EN-US/Images/IP_COMMENT_EXIST.gif" width=5 height=10        STOOL TESTS     C difficile by pcr        NEGATIVE   Diagnostics: Dg Chest 2 View  02/14/2012 *RADIOLOGY REPORT* Clinical Data: Cough. CHEST - 2 VIEW Comparison: No priors. Findings: Lung volumes are normal. No consolidative airspace disease. No pleural effusions. No pneumothorax. No pulmonary nodule or mass noted. Pulmonary vasculature and the cardiomediastinal silhouette are within normal limits. IMPRESSION: 1. No radiographic evidence of acute cardiopulmonary disease. Original Report Authenticated By: Daniel Entrikin, M.D.  Ct Abdomen Pelvis W Contrast  02/14/2012 *RADIOLOGY REPORT* Clinical Data:  Nausea, vomiting and diarrhea. CT ABDOMEN AND PELVIS WITH CONTRAST Technique: Multidetector CT imaging of the abdomen and pelvis was performed following the standard protocol during bolus administration of intravenous contrast. Contrast: 100mL OMNIPAQUE IOHEXOL 300 MG/ML SOLN Comparison: No priors. Findings: Lung Bases: Small hiatal hernia. Otherwise, unremarkable. Abdomen/Pelvis: The enhanced appearance of the liver, gallbladder, pancreas, spleen, bilateral adrenal glands and bilateral kidneys is unremarkable. No ascites or pneumoperitoneum and no pathologic distension of small bowel. No definite pathologic lymphadenopathy identified. Uterus and bilateral ovaries are unremarkable in appearance. Urinary bladder is nearly complete decompressed and unremarkable in appearance. Musculoskeletal: There are no aggressive appearing lytic or blastic lesions noted in the visualized portions of the skeleton. IMPRESSION: 1. No acute abnormalities in the abdomen or pelvis to account for the patient's symptoms. 2. Small hiatal hernia. Original Report Authenticated   By: Daniel Entrikin, M.D.  Procedures: none  EKG: Sinus tachycardia  Full Code  Hospital Course: See H&P for complete admission details. Ms. Gaylord is a 46 your old white female from [place] who presented with vomiting and diarrhea for 5 days. She became very dehydrated and had a syncopal spell prior to admission. She had been seen in the [facility] emergency room, given IV fluids and sent home. Her symptoms persisted so she came to the [facility] emergency room. She was noted to be clinically dehydrated with hyponatremia, hemoconcentration and hypokalemia. CT of abdomen negative. Vital signs are unremarkable. She appeared weak. Dry mucous membranes. Abdomen was soft nontender. White blood cell count was normal. Her urinalysis showed many white blood cells, but had negative nitrites, negative leukocyte esterase, and was likely contaminated based on the  presence of many epithelial cells. Initially, she was started empirically on Flagyl and Cipro. These were stopped. She had no urinary symptoms. C. difficile was negative. To date, stool culture is negative. She likely had a viral gastroenteritis. She has a history of rheumatoid arthritis and is on chronic prednisone and methotrexate therapy. Her vomiting resolved. She still has frequent stools, but they are becoming more formed. She is tolerating a solid diet. She no longer feels weak. She is euvolemic. She is stable for discharge. She should hold her methotrexate until her symptoms resolved. Total time on the day of discharge greater than 30 minutes.  Discharge Exam:  Blood pressure 99/65, pulse 62, temperature 97.9 F (36.6 C), temperature source Oral, resp. rate 18, height 5' 3" (1.6 m), weight 85.276 kg (188 lb), SpO2 96.00%.  General: Comfortable. Nontoxic. Eating lunch.  HEENT moist mucous membranes. No thrush.  Lungs clear to auscultation bilaterally without wheezes rhonchi or rales  Cardiovascular regular rate rhythm without murmurs gallops rubs  Abdomen soft nontender nondistended  Extremities no clubbing cyanosis or edema  Signed:  Fred Franzen L  02/17/2012, 1:18 PM   

## 2012-02-18 NOTE — Progress Notes (Signed)
Patient received discharge instructions along with follow up appointments and prescriptions. Patient verbalized understanding of all instructions. Patient was escorted by staff via wheelchair to vehicle. Patient discharged to home in stable condition. 

## 2012-02-19 LAB — STOOL CULTURE

## 2012-02-22 NOTE — Discharge Summary (Deleted)
Physician Discharge Summary   Patient ID:  Sabrina Jacobson  MRN: [id number]  DOB/AGE: Jul 09, 1965 46 y.o.  Admit date: 02/14/2012  Discharge date: 02/17/2012  Discharge Diagnoses:  Principal Problem:  *Gastroenteritis  Active Problems:  Rheumatoid arthritis  Vitamin D deficiency  GERD (gastroesophageal reflux disease)  Hypokalemia  Dehydration    Medication List      As of 02/17/2012 1:18 PM     STOP taking these medications        methotrexate 2.5 MG tablet     Commonly known as: RHEUMATREX     metroNIDAZOLE 500 MG tablet     Commonly known as: FLAGYL     TAKE these medications        diphenoxylate-atropine 2.5-0.025 MG per tablet     Commonly known as: LOMOTIL     Take 1-2 tablets by mouth 4 (four) times daily as needed for diarrhea or loose stools.     folic acid 1 MG tablet     Commonly known as: FOLVITE     Take 1 mg by mouth daily.     omeprazole 40 MG capsule     Commonly known as: PRILOSEC     Take 40 mg by mouth daily.     predniSONE 10 MG tablet     Commonly known as: DELTASONE     Take 10 mg by mouth every morning.     promethazine 12.5 MG tablet     Commonly known as: PHENERGAN     Take 1 tablet (12.5 mg total) by mouth every 6 (six) hours as needed for nausea.     Vitamin D (Ergocalciferol) 50000 UNITS Caps     Commonly known as: DRISDOL     Take 50,000 Units by mouth every 7 (seven) days.     VITAMIN D PO     Take 1 tablet by mouth daily.          Discharge Orders    Future Orders  Please Complete By  Expires    Diet general      Increase activity slowly      Discharge instructions      Comments:    Hold methotrexate until diarrhea resolves completely. Drink plenty of liquids.      Disposition: home  Discharged Condition: Stable  Consults: none  Labs:   Sodium        136  134      Potassium        3.1  3.4      Chloride        102  105      CO2        22  20      BUN        5  <3      Creatinine, Ser        0.72  0.65      Calcium         7.8  8.0      GFR calc non Af Amer        90 mL/min">90  90 mL/min">90      GFR calc Af Amer        90 mL/min The eGFR has been calculated using the CKD EPI equation. This calculation has not been validated in all clinical situations. eGFR's persistently <90 mL/min signify possible Chronic Kidney Disease.">9090 mL/min The eGFR has been calculated using the CKD EPI equation. This calculation has not been validated in all clinical situations.  eGFR's persistently <90 mL/min signify possible Chronic Kidney Disease." border=0 src="file:///C:/PROGRAM%20FILES%20(X86)/EPICSYS/V7.8/EN-US/Images/IP_COMMENT_EXIST.gif" width=5 height=10  90 mL/min The eGFR has been calculated using the CKD EPI equation. This calculation has not been validated in all clinical situations. eGFR's persistently <90 mL/min signify possible Chronic Kidney Disease.">9090 mL/min The eGFR has been calculated using the CKD EPI equation. This calculation has not been validated in all clinical situations. eGFR's persistently <90 mL/min signify possible Chronic Kidney Disease." border=0 src="file:///C:/PROGRAM%20FILES%20(X86)/EPICSYS/V7.8/EN-US/Images/IP_COMMENT_EXIST.gif" width=5 height=10      Glucose, Bld        82  91      Magnesium         1.5      Alkaline Phosphatase        53  37      Albumin        3.4  2.5      Lipase        79       AST        31  25      ALT        21  18      Total Protein        7.1  5.1      Total Bilirubin        0.6  0.3       CARDIAC PROFILE     Troponin I        <0.30        OTHER CHEM     Lactic Acid, Venous        1.1        CBC     WBC        5.7  5.8      RBC        4.45  4.60      Hemoglobin        12.8  13.3      HCT        37.9  39.2      MCV        85.2  85.2      MCH        28.8  28.9      MCHC        33.8  33.9      RDW        14.3  14.6      Platelets        187  201       DIFFERENTIAL     Neutrophils Relative        65       Lymphocytes Relative        15        Monocytes Relative        15       Eosinophils Relative        3       Basophils Relative        2       Neutro Abs        4.3       Lymphs Abs        1.0       Monocytes Absolute        1.0       Eosinophils Absolute        0.2       Basophils Absolute        0.1        DIABETES  Glucose, Bld        82  91       GONADAL     Preg Test, Ur        24 mIU/mL"NEGATIVE 24 mIU/mL" border=0 src="file:///C:/PROGRAM%20FILES%20(X86)/EPICSYS/V7.8/EN-US/Images/IP_COMMENT_EXIST.gif" width=5 height=10        URINALYSIS     Color, Urine        YELLOW       APPearance        CLEAR       Specific Gravity, Urine        1.030">1.030       pH        6.0       Glucose, UA        NEGATIVE       Bilirubin Urine        SMALL       Ketones, ur        80 mg/dL">80       Protein, ur        TRACE       Urobilinogen, UA        0.2       Nitrite        NEGATIVE       Leukocytes, UA        NEGATIVE       Hgb urine dipstick        SMALL       Urine-Other        MUCOUS PRESENT       WBC, UA        11-20       RBC / HPF        0-2       Squamous Epithelial / LPF        MANY       Bacteria, UA        MANY        URINE CHEMISTRY     Preg Test, Ur        24 mIU/mL"NEGATIVE 24 mIU/mL" border=0 src="file:///C:/PROGRAM%20FILES%20(X86)/EPICSYS/V7.8/EN-US/Images/IP_COMMENT_EXIST.gif" width=5 height=10        STOOL TESTS     C difficile by pcr        NEGATIVE   Diagnostics: Dg Chest 2 View  02/14/2012 *RADIOLOGY REPORT* Clinical Data: Cough. CHEST - 2 VIEW Comparison: No priors. Findings: Lung volumes are normal. No consolidative airspace disease. No pleural effusions. No pneumothorax. No pulmonary nodule or mass noted. Pulmonary vasculature and the cardiomediastinal silhouette are within normal limits. IMPRESSION: 1. No radiographic evidence of acute cardiopulmonary disease. Original Report Authenticated By: Trudie Reed, M.D.  Ct Abdomen Pelvis W Contrast  02/14/2012 *RADIOLOGY REPORT* Clinical Data:  Nausea, vomiting and diarrhea. CT ABDOMEN AND PELVIS WITH CONTRAST Technique: Multidetector CT imaging of the abdomen and pelvis was performed following the standard protocol during bolus administration of intravenous contrast. Contrast: OMNIPAQUE IOHEXOL 300 MG/ML SOLN Comparison: No priors. Findings: Lung Bases: Small hiatal hernia. Otherwise, unremarkable. Abdomen/Pelvis: The enhanced appearance of the liver, gallbladder, pancreas, spleen, bilateral adrenal glands and bilateral kidneys is unremarkable. No ascites or pneumoperitoneum and no pathologic distension of small bowel. No definite pathologic lymphadenopathy identified. Uterus and bilateral ovaries are unremarkable in appearance. Urinary bladder is nearly complete decompressed and unremarkable in appearance. Musculoskeletal: There are no aggressive appearing lytic or blastic lesions noted in the visualized portions of the skeleton. IMPRESSION: 1. No acute abnormalities in the abdomen or pelvis to account for the patient's symptoms. 2. Small hiatal hernia. Original Report Authenticated  By: Trudie Reed, M.D.  Procedures: none  EKG: Sinus tachycardia  Full Code  Hospital Course: See H&P for complete admission details. Ms. Sampley is a 61 your old white female from [place] who presented with vomiting and diarrhea for 5 days. She became very dehydrated and had a syncopal spell prior to admission. She had been seen in the [facility] emergency room, given IV fluids and sent home. Her symptoms persisted so she came to the [facility] emergency room. She was noted to be clinically dehydrated with hyponatremia, hemoconcentration and hypokalemia. CT of abdomen negative. Vital signs are unremarkable. She appeared weak. Dry mucous membranes. Abdomen was soft nontender. White blood cell count was normal. Her urinalysis showed many white blood cells, but had negative nitrites, negative leukocyte esterase, and was likely contaminated based on the  presence of many epithelial cells. Initially, she was started empirically on Flagyl and Cipro. These were stopped. She had no urinary symptoms. C. difficile was negative. To date, stool culture is negative. She likely had a viral gastroenteritis. She has a history of rheumatoid arthritis and is on chronic prednisone and methotrexate therapy. Her vomiting resolved. She still has frequent stools, but they are becoming more formed. She is tolerating a solid diet. She no longer feels weak. She is euvolemic. She is stable for discharge. She should hold her methotrexate until her symptoms resolved. Total time on the day of discharge greater than 30 minutes.  Discharge Exam:  Blood pressure 99/65, pulse 62, temperature 97.9 F (36.6 C), temperature source Oral, resp. rate 18, height 5\' 3"  (1.6 m), weight 85.276 kg (188 lb), SpO2 96.00%.  General: Comfortable. Nontoxic. Eating lunch.  HEENT moist mucous membranes. No thrush.  Lungs clear to auscultation bilaterally without wheezes rhonchi or rales  Cardiovascular regular rate rhythm without murmurs gallops rubs  Abdomen soft nontender nondistended  Extremities no clubbing cyanosis or edema  Signed:  Aikam Vinje L  02/17/2012, 1:18 PM

## 2012-02-27 NOTE — Discharge Summary (Signed)
Physician Discharge Summary   Patient ID:  Sabrina Jacobson  MRN: [id number]  DOB/AGE: 07/22/1965 46 y.o.  Admit date: 02/14/2012  Discharge date: 02/17/2012  Discharge Diagnoses:  Principal Problem:  *Gastroenteritis  Active Problems:  Rheumatoid arthritis  Vitamin D deficiency  GERD (gastroesophageal reflux disease)  Hypokalemia  Dehydration    Medication List      As of 02/17/2012 1:18 PM     STOP taking these medications        methotrexate 2.5 MG tablet     Commonly known as: RHEUMATREX     metroNIDAZOLE 500 MG tablet     Commonly known as: FLAGYL     TAKE these medications        diphenoxylate-atropine 2.5-0.025 MG per tablet     Commonly known as: LOMOTIL     Take 1-2 tablets by mouth 4 (four) times daily as needed for diarrhea or loose stools.     folic acid 1 MG tablet     Commonly known as: FOLVITE     Take 1 mg by mouth daily.     omeprazole 40 MG capsule     Commonly known as: PRILOSEC     Take 40 mg by mouth daily.     predniSONE 10 MG tablet     Commonly known as: DELTASONE     Take 10 mg by mouth every morning.     promethazine 12.5 MG tablet     Commonly known as: PHENERGAN     Take 1 tablet (12.5 mg total) by mouth every 6 (six) hours as needed for nausea.     Vitamin D (Ergocalciferol) 50000 UNITS Caps     Commonly known as: DRISDOL     Take 50,000 Units by mouth every 7 (seven) days.     VITAMIN D PO     Take 1 tablet by mouth daily.          Discharge Orders    Future Orders  Please Complete By  Expires    Diet general      Increase activity slowly      Discharge instructions      Comments:    Hold methotrexate until diarrhea resolves completely. Drink plenty of liquids.      Disposition: home  Discharged Condition: Stable  Consults: none  Labs:   Sodium        136  134      Potassium        3.1  3.4      Chloride        102  105      CO2        22  20      BUN        5  <3      Creatinine, Ser        0.72  0.65      Calcium         7.8  8.0      GFR calc non Af Amer        90 mL/min">90  90 mL/min">90      GFR calc Af Amer        90 mL/min The eGFR has been calculated using the CKD EPI equation. This calculation has not been validated in all clinical situations. eGFR's persistently <90 mL/min signify possible Chronic Kidney Disease.">9090 mL/min The eGFR has been calculated using the CKD EPI equation. This calculation has not been validated in all clinical situations.   eGFR's persistently <90 mL/min signify possible Chronic Kidney Disease." border=0 src="file:///C:/PROGRAM%20FILES%20(X86)/EPICSYS/V7.8/EN-US/Images/IP_[email or web address].gif" width=5 height=10  90 mL/min The eGFR has been calculated using the CKD EPI equation. This calculation has not been validated in all clinical situations. eGFR's persistently <90 mL/min signify possible Chronic Kidney Disease.">9090 mL/min The eGFR has been calculated using the CKD EPI equation. This calculation has not been validated in all clinical situations. eGFR's persistently <90 mL/min signify possible Chronic Kidney Disease." border=0 src="file:///C:/PROGRAM%20FILES%20(X86)/EPICSYS/V7.8/EN-US/Images/IP_COMMENT_EXIST.gif" width=5 height=10      Glucose, Bld        82  91      Magnesium         1.5      Alkaline Phosphatase        53  37      Albumin        3.4  2.5      Lipase        79       AST        31  25      ALT        21  18      Total Protein        7.1  5.1      Total Bilirubin        0.6  0.3       CARDIAC PROFILE     Troponin I        <0.30        OTHER CHEM     Lactic Acid, Venous        1.1        CBC     WBC        5.7  5.8      RBC        4.45  4.60      Hemoglobin        12.8  13.3      HCT        37.9  39.2      MCV        85.2  85.2      MCH        28.8  28.9      MCHC        33.8  33.9      RDW        14.3  14.6      Platelets        187  201       DIFFERENTIAL     Neutrophils Relative        65       Lymphocytes Relative        15        Monocytes Relative        15       Eosinophils Relative        3       Basophils Relative        2       Neutro Abs        4.3       Lymphs Abs        1.0       Monocytes Absolute        1.0       Eosinophils Absolute        0.2       Basophils Absolute        0.1        DIABETES       Glucose, Bld        82  91       GONADAL     Preg Test, Ur        24 mIU/mL"NEGATIVE 24 mIU/mL" border=0 src="file:///C:/PROGRAM%20FILES%20(X86)/EPICSYS/V7.8/EN-US/Images/IP_COMMENT_[email or web address].gif" width=5 height=10        URINALYSIS     Color, Urine        YELLOW       APPearance        CLEAR       Specific Gravity, Urine        1.030">1.030       pH        6.0       Glucose, UA        NEGATIVE       Bilirubin Urine        SMALL       Ketones, ur        80 mg/dL">80       Protein, ur        TRACE       Urobilinogen, UA        0.2       Nitrite        NEGATIVE       Leukocytes, UA        NEGATIVE       Hgb urine dipstick        SMALL       Urine-Other        MUCOUS PRESENT       WBC, UA        11-20       RBC / HPF        0-2       Squamous Epithelial / LPF        MANY       Bacteria, UA        MANY        URINE CHEMISTRY     Preg Test, Ur        24 mIU/mL"NEGATIVE 24 mIU/mL" border=0 src="file:///C:/PROGRAM%20FILES%20(X86)/EPICSYS/V7.8/EN-US/Images/IP_[email or web address].gif" width=5 height=10        STOOL TESTS     C difficile by pcr        NEGATIVE   Diagnostics: Dg Chest 2 View  02/14/2012 *RADIOLOGY REPORT* Clinical Data: Cough. CHEST - 2 VIEW Comparison: No priors. Findings: Lung volumes are normal. No consolidative airspace disease. No pleural effusions. No pneumothorax. No pulmonary nodule or mass noted. Pulmonary vasculature and the cardiomediastinal silhouette are within normal limits. IMPRESSION: 1. No radiographic evidence of acute cardiopulmonary disease. Original Report Authenticated By: Daniel Entrikin, M.D.  Ct Abdomen Pelvis W Contrast  02/14/2012 *RADIOLOGY REPORT* Clinical Data:  Nausea, vomiting and diarrhea. CT ABDOMEN AND PELVIS WITH CONTRAST Technique: Multidetector CT imaging of the abdomen and pelvis was performed following the standard protocol during bolus administration of intravenous contrast. Contrast: 100mL OMNIPAQUE IOHEXOL 300 MG/ML SOLN Comparison: No priors. Findings: Lung Bases: Small hiatal hernia. Otherwise, unremarkable. Abdomen/Pelvis: The enhanced appearance of the liver, gallbladder, pancreas, spleen, bilateral adrenal glands and bilateral kidneys is unremarkable. No ascites or pneumoperitoneum and no pathologic distension of small bowel. No definite pathologic lymphadenopathy identified. Uterus and bilateral ovaries are unremarkable in appearance. Urinary bladder is nearly complete decompressed and unremarkable in appearance. Musculoskeletal: There are no aggressive appearing lytic or blastic lesions noted in the visualized portions of the skeleton. IMPRESSION: 1. No acute abnormalities in the abdomen or pelvis to account for the patient's symptoms. 2. Small hiatal hernia. Original Report Authenticated   By: Daniel Entrikin, M.D.  Procedures: none  EKG: Sinus tachycardia  Full Jacobson  Hospital Course: See H&P for complete admission details. Sabrina Jacobson is a 46 your old white female from [place] who presented with vomiting and diarrhea for 5 days. She became very dehydrated and had a syncopal spell prior to admission. She had been seen in the [facility] emergency room, given IV fluids and sent home. Her symptoms persisted so she came to the [place] emergency room. She was noted to be clinically dehydrated with hyponatremia, hemoconcentration and hypokalemia. CT of abdomen negative. Vital signs are unremarkable. She appeared weak. Dry mucous membranes. Abdomen was soft nontender. White blood cell count was normal. Her urinalysis showed many white blood cells, but had negative nitrites, negative leukocyte esterase, and was likely contaminated based on the  presence of many epithelial cells. Initially, she was started empirically on Flagyl and Cipro. These were stopped. She had no urinary symptoms. C. difficile was negative. To date, stool culture is negative. She likely had a viral gastroenteritis. She has a history of rheumatoid arthritis and is on chronic prednisone and methotrexate therapy. Her vomiting resolved. She still has frequent stools, but they are becoming more formed. She is tolerating a solid diet. She no longer feels weak. She is euvolemic. She is stable for discharge. She should hold her methotrexate until her symptoms resolved. Total time on the day of discharge greater than 30 minutes.  Discharge Exam:  Blood pressure 99/65, pulse 62, temperature 97.9 F (36.6 C), temperature source Oral, resp. rate 18, height 5' 3" (1.6 m), weight 85.276 kg (188 lb), SpO2 96.00%.  General: Comfortable. Nontoxic. Eating lunch.  HEENT moist mucous membranes. No thrush.  Lungs clear to auscultation bilaterally without wheezes rhonchi or rales  Cardiovascular regular rate rhythm without murmurs gallops rubs  Abdomen soft nontender nondistended  Extremities no clubbing cyanosis or edema  Signed:  Mahlet Jergens L  02/17/2012, 1:18 PM   

## 2012-02-28 LAB — NOROVIRUS GROUP 1 & 2 BY PCR, STOOL

## 2013-10-20 ENCOUNTER — Inpatient Hospital Stay (HOSPITAL_COMMUNITY)
Admission: EM | Admit: 2013-10-20 | Discharge: 2013-10-22 | DRG: 520 | Disposition: A | Discharge status: Home / Self Care | Payer: BC Managed Care – PPO | Attending: Neurosurgery | Admitting: Neurosurgery

## 2013-10-20 ENCOUNTER — Encounter (HOSPITAL_COMMUNITY): Payer: Self-pay | Admitting: Emergency Medicine

## 2013-10-20 ENCOUNTER — Emergency Department (HOSPITAL_COMMUNITY): Payer: BC Managed Care – PPO

## 2013-10-20 DIAGNOSIS — M069 Rheumatoid arthritis, unspecified: Secondary | ICD-10-CM | POA: Diagnosis present

## 2013-10-20 DIAGNOSIS — M5116 Intervertebral disc disorders with radiculopathy, lumbar region: Secondary | ICD-10-CM | POA: Diagnosis present

## 2013-10-20 DIAGNOSIS — IMO0002 Reserved for concepts with insufficient information to code with codable children: Secondary | ICD-10-CM

## 2013-10-20 DIAGNOSIS — M5126 Other intervertebral disc displacement, lumbar region: Principal | ICD-10-CM | POA: Diagnosis present

## 2013-10-20 LAB — BASIC METABOLIC PANEL
Anion gap: 11 (ref 5–15)
BUN: 23 mg/dL (ref 6–23)
CALCIUM: 8.6 mg/dL (ref 8.4–10.5)
CO2: 28 mEq/L (ref 19–32)
CREATININE: 0.77 mg/dL (ref 0.50–1.10)
Chloride: 98 mEq/L (ref 96–112)
GFR calc Af Amer: >90 mL/min (ref >90)
GLUCOSE: 94 mg/dL (ref 70–99)
Potassium: 4.4 mEq/L (ref 3.7–5.3)
Sodium: 137 mEq/L (ref 137–147)

## 2013-10-20 LAB — CBC WITH DIFFERENTIAL/PLATELET
Basophils Absolute: 0 10*3/uL (ref 0.0–0.1)
Basophils Relative: 0 % (ref 0–1)
EOS ABS: 0 10*3/uL (ref 0.0–0.7)
EOS PCT: 0 % (ref 0–5)
HCT: 42.7 % (ref 36.0–46.0)
HEMOGLOBIN: 14.2 g/dL (ref 12.0–15.0)
LYMPHS ABS: 1.4 10*3/uL (ref 0.7–4.0)
Lymphocytes Relative: 8 % — ABNORMAL LOW (ref 12–46)
MCH: 30 pg (ref 26.0–34.0)
MCHC: 33.3 g/dL (ref 30.0–36.0)
MCV: 90.1 fL (ref 78.0–100.0)
MONOS PCT: 9 % (ref 3–12)
Monocytes Absolute: 1.6 10*3/uL — ABNORMAL HIGH (ref 0.1–1.0)
Neutro Abs: 14.8 10*3/uL — ABNORMAL HIGH (ref 1.7–7.7)
Neutrophils Relative %: 83 % — ABNORMAL HIGH (ref 43–77)
PLATELETS: 225 10*3/uL (ref 150–400)
RBC: 4.74 MIL/uL (ref 3.87–5.11)
RDW: 14.1 % (ref 11.5–15.5)
WBC: 17.8 10*3/uL — ABNORMAL HIGH (ref 4.0–10.5)

## 2013-10-20 LAB — URINALYSIS, ROUTINE W REFLEX MICROSCOPIC
Bilirubin Urine: NEGATIVE
GLUCOSE, UA: NEGATIVE mg/dL
HGB URINE DIPSTICK: NEGATIVE
KETONES UR: NEGATIVE mg/dL
Leukocytes, UA: NEGATIVE
Nitrite: NEGATIVE
PROTEIN: NEGATIVE mg/dL
Specific Gravity, Urine: 1.016 (ref 1.005–1.030)
Urobilinogen, UA: 1 mg/dL (ref 0.0–1.0)
pH: 6.5 (ref 5.0–8.0)

## 2013-10-20 MED ORDER — HYDROMORPHONE HCL PF 1 MG/ML IJ SOLN
1.0000 mg | INTRAMUSCULAR | Status: DC | PRN
  Administered 2013-10-21 (×2): 1 mg via INTRAVENOUS
  Filled 2013-10-20 (×2): qty 1

## 2013-10-20 MED ORDER — VITAMIN D 1000 UNITS PO TABS: 1000.0000 [IU] | ORAL_TABLET | Freq: Every day | ORAL | Status: DC

## 2013-10-20 MED ORDER — ACETAMINOPHEN 650 MG RE SUPP: 650.0000 mg | Freq: Four times a day (QID) | RECTAL | Status: DC | PRN

## 2013-10-20 MED ORDER — VANCOMYCIN HCL IN DEXTROSE 1-5 GM/200ML-% IV SOLN
1000.0000 mg | INTRAVENOUS | Status: AC
  Administered 2013-10-21: 1000 mg via INTRAVENOUS
  Filled 2013-10-20: qty 200

## 2013-10-20 MED ORDER — SODIUM CHLORIDE 0.9 % IJ SOLN: 3.0000 mL | INTRAMUSCULAR | Status: DC | PRN

## 2013-10-20 MED ORDER — FENTANYL CITRATE 0.05 MG/ML IJ SOLN
50.0000 ug | Freq: Once | INTRAMUSCULAR | Status: AC
  Administered 2013-10-20: 50 ug via INTRAVENOUS
  Filled 2013-10-20: qty 2

## 2013-10-20 MED ORDER — OXYCODONE-ACETAMINOPHEN 5-325 MG PO TABS
1.0000 | ORAL_TABLET | Freq: Once | ORAL | Status: DC
  Filled 2013-10-20: qty 1

## 2013-10-20 MED ORDER — ONDANSETRON 4 MG PO TBDP
4.0000 mg | ORAL_TABLET | Freq: Once | ORAL | Status: AC
  Administered 2013-10-20: 4 mg via ORAL
  Filled 2013-10-20: qty 1

## 2013-10-20 MED ORDER — OXYCODONE-ACETAMINOPHEN 5-325 MG PO TABS
2.0000 | ORAL_TABLET | Freq: Once | ORAL | Status: AC
  Administered 2013-10-20: 2 via ORAL

## 2013-10-20 MED ORDER — PREDNISONE 5 MG PO TABS: 10.0000 mg | ORAL_TABLET | Freq: Every morning | ORAL | Status: DC

## 2013-10-20 MED ORDER — SODIUM CHLORIDE 0.9 % IV SOLN: 250.0000 mL | INTRAVENOUS | Status: DC | PRN

## 2013-10-20 MED ORDER — HYDROMORPHONE HCL PF 1 MG/ML IJ SOLN: 1.0000 mg | Freq: Once | INTRAMUSCULAR | Status: DC

## 2013-10-20 MED ORDER — CYCLOBENZAPRINE HCL 10 MG PO TABS
10.0000 mg | ORAL_TABLET | Freq: Two times a day (BID) | ORAL | Status: DC
  Administered 2013-10-20: 10 mg via ORAL
  Filled 2013-10-20: qty 1

## 2013-10-20 MED ORDER — SODIUM CHLORIDE 0.9 % IJ SOLN
3.0000 mL | Freq: Two times a day (BID) | INTRAMUSCULAR | Status: DC
  Administered 2013-10-20: 3 mL via INTRAVENOUS

## 2013-10-20 MED ORDER — ACETAMINOPHEN 325 MG PO TABS: 650.0000 mg | ORAL_TABLET | Freq: Four times a day (QID) | ORAL | Status: DC | PRN

## 2013-10-20 MED ORDER — OXYCODONE-ACETAMINOPHEN 5-325 MG PO TABS
2.0000 | ORAL_TABLET | Freq: Once | ORAL | Status: AC
  Administered 2013-10-20: 1 via ORAL
  Filled 2013-10-20: qty 2

## 2013-10-20 MED ORDER — OXYCODONE HCL 5 MG PO TABS
5.0000 mg | ORAL_TABLET | ORAL | Status: DC | PRN
  Administered 2013-10-20 – 2013-10-21 (×3): 5 mg via ORAL
  Filled 2013-10-20 (×3): qty 1

## 2013-10-20 MED ORDER — PANTOPRAZOLE SODIUM 40 MG PO TBEC: 40.0000 mg | DELAYED_RELEASE_TABLET | Freq: Every day | ORAL | Status: DC

## 2013-10-20 MED ORDER — ONDANSETRON HCL 4 MG/2ML IJ SOLN: 4.0000 mg | Freq: Three times a day (TID) | INTRAMUSCULAR | Status: DC | PRN

## 2013-10-20 NOTE — ED Notes (Signed)
Walked to wall from bed and had a hard time walking because of pain.

## 2013-10-20 NOTE — ED Notes (Signed)
Returned from MRI 

## 2013-10-20 NOTE — ED Notes (Signed)
Patient transported to MRI 

## 2013-10-20 NOTE — ED Provider Notes (Signed)
CSN: [id number]     Arrival date & time 10/20/13  1106 History   First MD Initiated Contact with Patient 10/20/13 1223     Chief Complaint  Patient presents with  . Back Pain  . Leg Pain     (Consider location/radiation/quality/duration/timing/severity/associated sxs/prior Treatment) HPI  Sabrina Jacobson Is a 48 year old female with a past medical history of rheumatoid arthritis. She is on chronic prednisone, methotrexate and Enbrel for management. Patient complains of one month of progressively worsening severe lower back pain radiating down the posterior leg with paresthesias around the front of her leg. The patient states that she has had several falls over the past month due to the left leg weakness and buckling underneath her. The last fall which occurred 4 days ago has essentially left her unable to ambulate. She states that the pain is so severe with movement that she's had to use a makeshift bedpan at home. The patient has been taking a prednisone Dosepak on top of her daily 5 mg prednisone along with tramadol with no significant relief of her symptoms. She describes the pain as 10 out of 10, severe, sharp, constant, radiating, worse with movement. She denies any overflow incontinence or loss of rectal. She denies fevers, headaches, photophobia, rash, neck stiffness, recent procedures to her back or IV drug use.   Past Medical History  Diagnosis Date  . Arthritis    Past Surgical History  Procedure Laterality Date  . Cesarean section     No family history on file. History  Substance Use Topics  . Smoking status: Never Smoker   . Smokeless tobacco: Not on file  . Alcohol Use: No   OB History   Grav Para Term Preterm Abortions TAB SAB Ect Mult Living                 Review of Systems  Ten systems reviewed and are negative for acute change, except as noted in the HPI.    Allergies  Amoxicillin; Ceclor; and Codeine  Home Medications   Prior to Admission medications    Medication Sig Start Date End Date Taking? Authorizing Provider  Cholecalciferol (VITAMIN D PO) Take 1 tablet by mouth daily.    Historical Provider, MD  diphenoxylate-atropine (LOMOTIL) 2.5-0.025 MG per tablet Take 1-2 tablets by mouth 4 (four) times daily as needed for diarrhea or loose stools. 02/17/12   Delfina Redwood, MD  folic acid (FOLVITE) 1 MG tablet Take 1 mg by mouth daily.    Historical Provider, MD  omeprazole (PRILOSEC) 40 MG capsule Take 40 mg by mouth daily.    Historical Provider, MD  predniSONE (DELTASONE) 10 MG tablet Take 10 mg by mouth every morning.    Historical Provider, MD  promethazine (PHENERGAN) 12.5 MG tablet Take 1 tablet (12.5 mg total) by mouth every 6 (six) hours as needed for nausea. 02/17/12   Delfina Redwood, MD  Vitamin D, Ergocalciferol, (DRISDOL) 50000 UNITS CAPS Take 50,000 Units by mouth every 7 (seven) days.    Historical Provider, MD   BP 130/83  Pulse 85  Temp(Src) 98.5 F (36.9 C) (Oral)  Resp 16  Ht 5' 3.5" (1.613 m)  Wt 180 lb (81.647 kg)  BMI 31.38 kg/m2  SpO2 98% Physical Exam  Nursing note and vitals reviewed. Constitutional: She is oriented to person, place, and time. She appears well-developed and well-nourished. No distress.  HENT:  Head: Normocephalic and atraumatic.  Eyes: Conjunctivae are normal. No scleral icterus.  Neck: Normal range  of motion.  Cardiovascular: Normal rate, regular rhythm and normal heart sounds.  Exam reveals no gallop and no friction rub.   No murmur heard. Pulmonary/Chest: Effort normal and breath sounds normal. No respiratory distress.  Abdominal: Soft. Bowel sounds are normal. She exhibits no distension and no mass. There is no tenderness. There is no guarding.  Musculoskeletal:  Physical exam of the back shows no midline tenderness, no palpable spasm, no tenderness to palpation over the bilateral SI joints. She has normal deep tendon and patellar reflex on the left and the right. She has exquisite  tenderness in sharp pain elicited with a minimal flexion of the left hip. She is unable to tolerate movement of the leg. Patient did get up to standing however required assistance from both myself and her husband. She is able to take only 2 or 3 steps with significant support for.\ both of [place]. Subjective paresthesia and numbness in the L3-L4 nerve distribution on the left leg. Distal pulses intact appear  Neurological: She is alert and oriented to person, place, and time.  Skin: Skin is warm and dry. She is not diaphoretic.    ED Course  Procedures (including critical care time) Labs Review Labs Reviewed - No data to display  Imaging Review No results found.   EKG Interpretation None      MDM   Final diagnoses:  None     Filed Vitals:   10/20/13 1315 10/20/13 1532 10/20/13 1535 10/20/13 1617  BP: 127/68 123/67  114/74  Pulse: 67  76 83  Temp:      TempSrc:      Resp:    18  Height:      Weight:      SpO2: 95%  98% 96%    Patient with significant neurologic symptoms on the left leg. Although she does not have red flag symptoms she is losing balance and falling on the left leg as it is occasionally giving way underneath her. 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 I discussed the case with Dr. is on call for neurosurgery. He states that if her pain is well controlled and she is able to ambulate and she may go home and follow up with him this week in the office however if patient's pain is unable to be controlled him we'll need to be admitted and set up for more urgent surgery. I discussed the findings with the patient who understands her diagnosis agrees with plan of care. I've spoken with PA Kirichenko.  Patient appears stable throughout her visit here at the emergency department.  I personally reviewed the imaging tests through PACS system. I have reviewed and interpreted Lab values. The patient's leukocytosis is  secondary to her prednisone use.  I reviewed available ER/hospitalization records through the [place], PA-C 10/20/13 [place], PA-C 10/20/13 [phone]

## 2013-10-20 NOTE — ED Notes (Signed)
Pt. Stated, I started having back pain and thought it was muscle and then progressing lve got worse, Ive got worse in her legs in the last 4 days,  Its radiating across my bladder til I hardly can get to the bathroom on time.Marland Kitchen  i'VE GONE TO DOCTOR TWICE given steroids and pain medication.

## 2013-10-20 NOTE — ED Provider Notes (Signed)
Medical screening examination/treatment/procedure(s) were performed by non-physician practitioner and as supervising physician I was immediately available for consultation/collaboration.   EKG Interpretation None       Orlie Dakin, MD 10/20/13 1821

## 2013-10-20 NOTE — H&P (Signed)
Sabrina Jacobson is an 48 y.o. female.   Chief Complaint: Left leg pain HPI: 48 year old female with left lower extremity pain, numbness and weakness. Patient's symptoms first began approximately 1 month ago with severe left groin and anterior thigh pain. She began to notice weakness as well. She was evaluated and underwent treatment with oral steroids with minimal improvement. Approximately 2 weeks ago the patient's pain changed in character and now she began to have pain into her left buttocks posterior lateral thigh and lateral leg as well. She has numbness and tingling along both distributions of her pain. She has no right-sided symptoms. She has no bowel or bladder dysfunction. She is miserable with the symptoms. She required evaluation in the emergency room and had to be admitted for pain control tonight. Patient has no history injury or accident.  Past Medical History  Diagnosis Date  . Arthritis     Past Surgical History  Procedure Laterality Date  . Cesarean section      No family history on file. Social History:  reports that she has never smoked. She does not have any smokeless tobacco history on file. She reports that she does not drink alcohol or use illicit drugs.  Allergies:  Allergies  Allergen Reactions  . Amoxicillin Hives and Other (See Comments)    Reaction unknown: Possible hives to either this medication or codeine  . Ceclor [Cefaclor] Hives    Possible Reaction of hives   . Codeine Nausea And Vomiting    Medications Prior to Admission  Medication Sig Dispense Refill  . cholecalciferol (VITAMIN D) 1000 UNITS tablet Take 1,000 Units by mouth daily.      . cyclobenzaprine (FLEXERIL) 10 MG tablet Take 10 mg by mouth 2 (two) times daily.      Marland Kitchen etanercept (ENBREL) 50 MG/ML injection Inject 50 mg into the skin once a week.      . folic acid (FOLVITE) 1 MG tablet Take 1 mg by mouth daily.      . methotrexate (RHEUMATREX) 2.5 MG tablet Take 12.5 mg by mouth once a week.  Caution:Chemotherapy. Protect from light.      . Multiple Vitamin (MULTIVITAMIN WITH MINERALS) TABS tablet Take 1 tablet by mouth daily.      Marland Kitchen omeprazole (PRILOSEC) 40 MG capsule Take 40 mg by mouth daily.      . predniSONE (DELTASONE) 10 MG tablet Take 10 mg by mouth every morning.      . predniSONE (DELTASONE) 5 MG tablet Take 5 mg by mouth daily with breakfast.      . traMADol (ULTRAM) 50 MG tablet Take 50 mg by mouth every 6 (six) hours as needed for moderate pain.      . vitamin E 400 UNIT capsule Take 400 Units by mouth daily.        Results for orders placed during the hospital encounter of 10/20/13 (from the past 48 hour(s))  CBC WITH DIFFERENTIAL     Status: Abnormal   Collection Time    10/20/13 12:45 PM      Result Value Ref Range   WBC 17.8 (*) 4.0 - 10.5 K/uL   RBC 4.74  3.87 - 5.11 MIL/uL   Hemoglobin 14.2  12.0 - 15.0 g/dL   HCT 42.7  36.0 - 46.0 %   MCV 90.1  78.0 - 100.0 fL   MCH 30.0  26.0 - 34.0 pg   MCHC 33.3  30.0 - 36.0 g/dL   RDW 14.1  11.5 - 15.5 %  Platelets 225  150 - 400 K/uL   Neutrophils Relative % 83 (*) 43 - 77 %   Neutro Abs 14.8 (*) 1.7 - 7.7 K/uL   Lymphocytes Relative 8 (*) 12 - 46 %   Lymphs Abs 1.4  0.7 - 4.0 K/uL   Monocytes Relative 9  3 - 12 %   Monocytes Absolute 1.6 (*) 0.1 - 1.0 K/uL   Eosinophils Relative 0  0 - 5 %   Eosinophils Absolute 0.0  0.0 - 0.7 K/uL   Basophils Relative 0  0 - 1 %   Basophils Absolute 0.0  0.0 - 0.1 K/uL  BASIC METABOLIC PANEL     Status: None   Collection Time    10/20/13 12:45 PM      Result Value Ref Range   Sodium 137  137 - 147 mEq/L   Potassium 4.4  3.7 - 5.3 mEq/L   Chloride 98  96 - 112 mEq/L   CO2 28  19 - 32 mEq/L   Glucose, Bld 94  70 - 99 mg/dL   BUN 23  6 - 23 mg/dL   Creatinine, Ser 0.77  0.50 - 1.10 mg/dL   Calcium 8.6  8.4 - 10.5 mg/dL   GFR calc non Af Amer >90  >90 mL/min   GFR calc Af Amer >90  >90 mL/min   Comment: (NOTE)     The eGFR has been calculated using the CKD EPI  equation.     This calculation has not been validated in all clinical situations.     eGFR's persistently <90 mL/min signify possible Chronic Kidney     Disease.   Anion gap 11  5 - 15  URINALYSIS, ROUTINE W REFLEX MICROSCOPIC     Status: None   Collection Time    10/20/13  1:16 PM      Result Value Ref Range   Color, Urine YELLOW  YELLOW   APPearance CLEAR  CLEAR   Specific Gravity, Urine 1.016  1.005 - 1.030   pH 6.5  5.0 - 8.0   Glucose, UA NEGATIVE  NEGATIVE mg/dL   Hgb urine dipstick NEGATIVE  NEGATIVE   Bilirubin Urine NEGATIVE  NEGATIVE   Ketones, ur NEGATIVE  NEGATIVE mg/dL   Protein, ur NEGATIVE  NEGATIVE mg/dL   Urobilinogen, UA 1.0  0.0 - 1.0 mg/dL   Nitrite NEGATIVE  NEGATIVE   Leukocytes, UA NEGATIVE  NEGATIVE   Comment: MICROSCOPIC NOT DONE ON URINES WITH NEGATIVE PROTEIN, BLOOD, LEUKOCYTES, NITRITE, OR GLUCOSE <1000 mg/dL.   Dg Lumbar Spine Complete  10/20/2013   CLINICAL DATA:  Low back and left hip pain  EXAM: LUMBAR SPINE - COMPLETE 4+ VIEW  COMPARISON:  Concurrently obtained radiographs of the pelvis and left hip ; lumbar spine MRI obtained concurrently  FINDINGS: No acute fracture or malalignment. No focal degenerative change. Mild straightening of the normal lumbar lordosis which is nonspecific. Normal bony mineralization. No lytic or blastic osseous lesion.  IMPRESSION: 1. No acute fracture or malalignment. 2. Straightening of the normal lumbar lordosis. 3. No osseous degenerative changes.   Electronically Signed   By: Jacqulynn Cadet M.D.   On: 10/20/2013 15:35   Dg Hip Complete Left  10/20/2013   CLINICAL DATA:  Lower back and left hip pain  EXAM: LEFT HIP - COMPLETE 2+ VIEW  COMPARISON:  Concurrently obtained radiographs of the lumbar spine  FINDINGS: There is no evidence of hip fracture or dislocation. There is no evidence of arthropathy or  other focal bone abnormality.  IMPRESSION: Negative.   Electronically Signed   By: Jacqulynn Cadet M.D.   On: 10/20/2013  15:35   Mr Lumbar Spine Wo Contrast  10/20/2013   CLINICAL DATA:  LEFT leg weakness with pain and paresthesias.  EXAM: MRI LUMBAR SPINE WITHOUT CONTRAST  TECHNIQUE: Multiplanar, multisequence MR imaging of the lumbar spine was performed. No intravenous contrast was administered.  COMPARISON:  02/14/2012.  FINDINGS: Segmentation: 5 lumbar type vertebral bodies identified on prior CT. The numbering convention used for this exam termed L5-S1 as the last intervertebral disc space.  Alignment:  Mild straightening of the normal lumbar lordosis.  Vertebrae: Vertebral body height is preserved. No aggressive osseous lesions. Negative for bone marrow edema.  Conus medullaris: Normal termination at T12-L1:  Paraspinal tissues: Normal.  Disc levels:  T12-L1:  Negative.  L1-L2:  Negative.  L2-L3: There is a small mass in the LEFT lateral central canal filling the LEFT lateral recess inferior to the disc space. This measures 1 cm transverse and 8 mm AP. Craniocaudal extent is 2.2 cm. This extends from the L2-L3 subarticular zone at the level of the disc space into the lateral recess inferior to the L3 vertebra. This mass extends to the level of the LEFT L3-L4 medial foramen. This compresses the descending LEFT L3 nerve. The appearance is most compatible with a sequestered disc fragment. There may be some superimposed peridural hemorrhage. There appears to be an L2-L3 shallow LEFT foraminal protrusion and annular tear that likely represents the source of the sequestered disc material.  L3-L4: Central concentric annular tear. Central canal and lateral recesses are patent. The neural foramina appear patent aside from disc fragment entering the LEFT L3-L4 foramen discussed above.  L4-L5: Disc desiccation. There is a LEFT subarticular zone disc extrusion compressing the descending LEFT L5 nerve. The L4 nerves appear normal in the neural foramina. There is mild central stenosis associated with the extrusion. The extrusion shows mild  caudal migration of disc material in the LEFT lateral recess. There appears to be a small sequestered fragment measuring 7 mm x 8 mm on axial imaging. This is difficult to visualize on sagittal imaging but measures about 1 cm.  L5-S1: RIGHT eccentric disc bulging that just contacts the descending RIGHT S1 nerve as it approaches the lateral recess without neural compression. Central canal and foramina patent.  IMPRESSION: 1. Mass dorsal to the L3 vertebra is most compatible with sequestered disc fragment likely from the L2-L3 disc. This fills the LEFT lateral recess and compresses the descending LEFT L3 nerve. 2. L4-L5 LEFT subarticular zone disc extrusion with mild caudal migration of disc material compressing the descending LEFT L5 nerve in the lateral recess and producing mild L4-L5 central stenosis.   Electronically Signed   By: Dereck Ligas M.D.   On: 10/20/2013 15:34    A comprehensive review of systems was negative.  Blood pressure 122/72, pulse 71, temperature 98.3 F (36.8 C), temperature source Oral, resp. rate 18, height 5' 3"  (1.6 m), weight 87.136 kg (192 lb 1.6 oz), SpO2 95.00%.  The patient is awake and alert. She is oriented and appropriate. Her speech is fluent. Judgment and insight are intact. Cranial nerve function is intact. Motor examination of her upper extremities is normal. Lower extremity motor function reveals significant weakness of her left quadriceps muscle group grating at 4 minus over 5 with wasting. Patient also with mild 4/5 weakness of her left extensor hallucis longus. Otherwise motor strength intact. Sensory examination reveals  decreased sensation to pinprick and light touch in her left L3 and left L5 dermatome. Straight leg raising is positive on the left negative on the right. Deep tendon reflexes are normal active except her left patellar reflex is 1/4. Examination of head ears eyes nose and throat is unremarkable. Neck is supple. Pulses are normal. Airways midline.  Chest clear to auscultation. Abdomen benign. Extremities free from injury or deformity. 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 Patient has failed conservative management with oral steroids and time. She has significant weakness, pain and sensory loss with marked structural abnormality on her MRI scan consistent with her pain. I recommended that she undergo a left-sided L2-3 and left-sided L4-5 laminotomy and microdiscectomy in hopes of improving her symptoms. I've discussed the risks the benefits involved with surgery including but not limited to the risk of anesthesia, bleeding, infection, CSF leak, nerve root injury, disc reherniation, later instability, continued pain, and non-and affect. The patient has been given the opportunity to ask questions. She appears to understand. She wishes to proceed with surgery. Plan surgery tomorrow afternoon.  Mariadelcarmen Corella A 10/20/2013, 9:25 PM

## 2013-10-21 ENCOUNTER — Inpatient Hospital Stay (HOSPITAL_COMMUNITY): Payer: BC Managed Care – PPO | Admitting: Anesthesiology

## 2013-10-21 ENCOUNTER — Encounter (HOSPITAL_COMMUNITY): Payer: BC Managed Care – PPO | Admitting: Anesthesiology

## 2013-10-21 ENCOUNTER — Inpatient Hospital Stay (HOSPITAL_COMMUNITY): Payer: BC Managed Care – PPO

## 2013-10-21 ENCOUNTER — Encounter (HOSPITAL_COMMUNITY): Payer: Self-pay | Admitting: Anesthesiology

## 2013-10-21 ENCOUNTER — Encounter (HOSPITAL_COMMUNITY)
Admission: EM | Disposition: A | Discharge status: Home / Self Care | Payer: Self-pay | Source: Home / Self Care | Attending: Neurosurgery

## 2013-10-21 HISTORY — PX: LUMBAR LAMINECTOMY/DECOMPRESSION MICRODISCECTOMY: SHX5026

## 2013-10-21 LAB — SURGICAL PCR SCREEN
MRSA, PCR: NEGATIVE
Staphylococcus aureus: NEGATIVE

## 2013-10-21 SURGERY — LUMBAR LAMINECTOMY/DECOMPRESSION MICRODISCECTOMY 2 LEVELS
Anesthesia: General | Site: Back | Laterality: Left

## 2013-10-21 MED ORDER — PROPOFOL 10 MG/ML IV BOLUS
INTRAVENOUS | Status: AC
  Filled 2013-10-21: qty 20

## 2013-10-21 MED ORDER — HEMOSTATIC AGENTS (NO CHARGE) OPTIME
TOPICAL | Status: DC | PRN
  Administered 2013-10-21: 1 via TOPICAL

## 2013-10-21 MED ORDER — HYDROCODONE-ACETAMINOPHEN 5-325 MG PO TABS: 1.0000 | ORAL_TABLET | ORAL | Status: DC | PRN

## 2013-10-21 MED ORDER — OXYCODONE HCL 5 MG PO TABS
ORAL_TABLET | ORAL | Status: AC
  Filled 2013-10-21: qty 1

## 2013-10-21 MED ORDER — VANCOMYCIN HCL IN DEXTROSE 1-5 GM/200ML-% IV SOLN
1000.0000 mg | Freq: Once | INTRAVENOUS | Status: AC
  Administered 2013-10-21: 1000 mg via INTRAVENOUS
  Filled 2013-10-21: qty 200

## 2013-10-21 MED ORDER — THROMBIN 5000 UNITS EX SOLR
CUTANEOUS | Status: DC | PRN
  Administered 2013-10-21 (×2): 5000 [IU] via TOPICAL

## 2013-10-21 MED ORDER — MENTHOL 3 MG MT LOZG: 1.0000 | LOZENGE | OROMUCOSAL | Status: DC | PRN

## 2013-10-21 MED ORDER — POLYETHYLENE GLYCOL 3350 17 G PO PACK: 17.0000 g | PACK | Freq: Every day | ORAL | Status: DC | PRN

## 2013-10-21 MED ORDER — BUPIVACAINE HCL (PF) 0.25 % IJ SOLN
INTRAMUSCULAR | Status: DC | PRN
  Administered 2013-10-21: 20 mL

## 2013-10-21 MED ORDER — OXYCODONE HCL 5 MG/5ML PO SOLN: 5.0000 mg | Freq: Once | ORAL | Status: AC | PRN

## 2013-10-21 MED ORDER — OXYCODONE HCL 5 MG PO TABS
5.0000 mg | ORAL_TABLET | Freq: Once | ORAL | Status: AC | PRN
  Administered 2013-10-21: 5 mg via ORAL

## 2013-10-21 MED ORDER — HYDROMORPHONE HCL PF 1 MG/ML IJ SOLN: 0.5000 mg | INTRAMUSCULAR | Status: DC | PRN

## 2013-10-21 MED ORDER — ALUM & MAG HYDROXIDE-SIMETH 200-200-20 MG/5ML PO SUSP: 30.0000 mL | Freq: Four times a day (QID) | ORAL | Status: DC | PRN

## 2013-10-21 MED ORDER — PHENOL 1.4 % MT LIQD: 1.0000 | OROMUCOSAL | Status: DC | PRN

## 2013-10-21 MED ORDER — OXYCODONE-ACETAMINOPHEN 5-325 MG PO TABS
1.0000 | ORAL_TABLET | ORAL | Status: DC | PRN
  Administered 2013-10-21 – 2013-10-22 (×3): 2 via ORAL
  Filled 2013-10-21 (×3): qty 2

## 2013-10-21 MED ORDER — THROMBIN 5000 UNITS EX SOLR
OROMUCOSAL | Status: DC | PRN
  Administered 2013-10-21: 18:00:00 via TOPICAL

## 2013-10-21 MED ORDER — THROMBIN 20000 UNITS EX SOLR
CUTANEOUS | Status: DC | PRN
  Administered 2013-10-21: 18:00:00 via TOPICAL

## 2013-10-21 MED ORDER — ACETAMINOPHEN 650 MG RE SUPP: 650.0000 mg | RECTAL | Status: DC | PRN

## 2013-10-21 MED ORDER — BISACODYL 10 MG RE SUPP: 10.0000 mg | Freq: Every day | RECTAL | Status: DC | PRN

## 2013-10-21 MED ORDER — SODIUM CHLORIDE 0.9 % IJ SOLN: 3.0000 mL | Freq: Two times a day (BID) | INTRAMUSCULAR | Status: DC

## 2013-10-21 MED ORDER — NEOSTIGMINE METHYLSULFATE 10 MG/10ML IV SOLN
INTRAVENOUS | Status: DC | PRN
  Administered 2013-10-21: 4 mg via INTRAVENOUS

## 2013-10-21 MED ORDER — GLYCOPYRROLATE 0.2 MG/ML IJ SOLN
INTRAMUSCULAR | Status: DC | PRN
  Administered 2013-10-21: .7 mg via INTRAVENOUS

## 2013-10-21 MED ORDER — HYDROMORPHONE HCL PF 1 MG/ML IJ SOLN
INTRAMUSCULAR | Status: AC
  Filled 2013-10-21: qty 1

## 2013-10-21 MED ORDER — ROCURONIUM BROMIDE 100 MG/10ML IV SOLN
INTRAVENOUS | Status: DC | PRN
  Administered 2013-10-21: 50 mg via INTRAVENOUS

## 2013-10-21 MED ORDER — ONDANSETRON HCL 4 MG/2ML IJ SOLN: 4.0000 mg | Freq: Four times a day (QID) | INTRAMUSCULAR | Status: DC | PRN

## 2013-10-21 MED ORDER — SUFENTANIL CITRATE 50 MCG/ML IV SOLN
INTRAVENOUS | Status: DC | PRN
  Administered 2013-10-21: 10 ug via INTRAVENOUS

## 2013-10-21 MED ORDER — SUFENTANIL CITRATE 50 MCG/ML IV SOLN
INTRAVENOUS | Status: AC
  Filled 2013-10-21: qty 1

## 2013-10-21 MED ORDER — ACETAMINOPHEN 325 MG PO TABS: 650.0000 mg | ORAL_TABLET | ORAL | Status: DC | PRN

## 2013-10-21 MED ORDER — SENNA 8.6 MG PO TABS
1.0000 | ORAL_TABLET | Freq: Two times a day (BID) | ORAL | Status: DC
  Administered 2013-10-21 – 2013-10-22 (×2): 8.6 mg via ORAL
  Filled 2013-10-21 (×2): qty 1

## 2013-10-21 MED ORDER — FLEET ENEMA 7-19 GM/118ML RE ENEM: 1.0000 | ENEMA | Freq: Once | RECTAL | Status: AC | PRN

## 2013-10-21 MED ORDER — SODIUM CHLORIDE 0.9 % IJ SOLN: 3.0000 mL | INTRAMUSCULAR | Status: DC | PRN

## 2013-10-21 MED ORDER — LACTATED RINGERS IV SOLN
INTRAVENOUS | Status: DC | PRN
  Administered 2013-10-21 (×2): via INTRAVENOUS

## 2013-10-21 MED ORDER — KETOROLAC TROMETHAMINE 30 MG/ML IJ SOLN
30.0000 mg | Freq: Four times a day (QID) | INTRAMUSCULAR | Status: DC
  Administered 2013-10-21 – 2013-10-22 (×2): 30 mg via INTRAVENOUS
  Filled 2013-10-21 (×2): qty 1

## 2013-10-21 MED ORDER — ONDANSETRON HCL 4 MG/2ML IJ SOLN
INTRAMUSCULAR | Status: DC | PRN
  Administered 2013-10-21: 4 mg via INTRAVENOUS

## 2013-10-21 MED ORDER — ONDANSETRON HCL 4 MG/2ML IJ SOLN: 4.0000 mg | INTRAMUSCULAR | Status: DC | PRN

## 2013-10-21 MED ORDER — PROPOFOL 10 MG/ML IV BOLUS
INTRAVENOUS | Status: DC | PRN
Start: 2013-10-21 — End: 2013-10-21
  Administered 2013-10-21: 140 mg via INTRAVENOUS

## 2013-10-21 MED ORDER — CYCLOBENZAPRINE HCL 10 MG PO TABS: 10.0000 mg | ORAL_TABLET | Freq: Three times a day (TID) | ORAL | Status: DC | PRN

## 2013-10-21 MED ORDER — SODIUM CHLORIDE 0.9 % IV SOLN
250.0000 mL | INTRAVENOUS | Status: DC
Start: 2013-10-21 — End: 2013-10-22

## 2013-10-21 MED ORDER — HYDROMORPHONE HCL PF 1 MG/ML IJ SOLN
0.2500 mg | INTRAMUSCULAR | Status: DC | PRN
  Administered 2013-10-21 (×4): 0.5 mg via INTRAVENOUS

## 2013-10-21 MED ORDER — 0.9 % SODIUM CHLORIDE (POUR BTL) OPTIME
TOPICAL | Status: DC | PRN
  Administered 2013-10-21: 1000 mL

## 2013-10-21 MED ORDER — SODIUM CHLORIDE 0.9 % IR SOLN
Status: DC | PRN
  Administered 2013-10-21: 18:00:00

## 2013-10-21 MED ORDER — KETOROLAC TROMETHAMINE 30 MG/ML IJ SOLN
INTRAMUSCULAR | Status: DC | PRN
  Administered 2013-10-21: 30 mg via INTRAVENOUS

## 2013-10-21 MED ORDER — MIDAZOLAM HCL 2 MG/2ML IJ SOLN
INTRAMUSCULAR | Status: AC
  Filled 2013-10-21: qty 2

## 2013-10-21 MED ORDER — LIDOCAINE HCL (CARDIAC) 20 MG/ML IV SOLN
INTRAVENOUS | Status: DC | PRN
  Administered 2013-10-21: 75 mg via INTRAVENOUS

## 2013-10-21 MED ORDER — MIDAZOLAM HCL 5 MG/5ML IJ SOLN
INTRAMUSCULAR | Status: DC | PRN
  Administered 2013-10-21: 2 mg via INTRAVENOUS

## 2013-10-21 SURGICAL SUPPLY — 57 items
BAG DECANTER FOR FLEXI CONT (MISCELLANEOUS) ×3 IMPLANT
BENZOIN TINCTURE PRP APPL 2/3 (GAUZE/BANDAGES/DRESSINGS) ×3 IMPLANT
BLADE 10 SAFETY STRL DISP (BLADE) IMPLANT
BLADE SURG ROTATE 9660 (MISCELLANEOUS) IMPLANT
BRUSH SCRUB EZ PLAIN DRY (MISCELLANEOUS) ×3 IMPLANT
BUR CUTTER 7.0 ROUND (BURR) ×3 IMPLANT
CANISTER SUCT 3000ML (MISCELLANEOUS) ×3 IMPLANT
CLOSURE WOUND 1/2 X4 (GAUZE/BANDAGES/DRESSINGS) ×1
CONT SPEC 4OZ CLIKSEAL STRL BL (MISCELLANEOUS) ×3 IMPLANT
DECANTER SPIKE VIAL GLASS SM (MISCELLANEOUS) ×3 IMPLANT
DERMABOND ADVANCED (GAUZE/BANDAGES/DRESSINGS) ×2
DERMABOND ADVANCED .7 DNX12 (GAUZE/BANDAGES/DRESSINGS) ×1 IMPLANT
DRAPE LAPAROTOMY 100X72X124 (DRAPES) ×3 IMPLANT
DRAPE MICROSCOPE LEICA (MISCELLANEOUS) ×3 IMPLANT
DRAPE POUCH INSTRU U-SHP 10X18 (DRAPES) ×3 IMPLANT
DRAPE PROXIMA HALF (DRAPES) IMPLANT
DRAPE SURG 17X23 STRL (DRAPES) ×6 IMPLANT
DRSG OPSITE POSTOP 4X6 (GAUZE/BANDAGES/DRESSINGS) ×3 IMPLANT
DURAPREP 26ML APPLICATOR (WOUND CARE) ×3 IMPLANT
ELECT REM PT RETURN 9FT ADLT (ELECTROSURGICAL) ×3
ELECTRODE REM PT RTRN 9FT ADLT (ELECTROSURGICAL) ×1 IMPLANT
GAUZE SPONGE 4X4 16PLY XRAY LF (GAUZE/BANDAGES/DRESSINGS) IMPLANT
GLOVE BIO SURGEON STRL SZ 6.5 (GLOVE) ×4 IMPLANT
GLOVE BIO SURGEONS STRL SZ 6.5 (GLOVE) ×2
GLOVE BIOGEL PI IND STRL 7.0 (GLOVE) ×1 IMPLANT
GLOVE BIOGEL PI INDICATOR 7.0 (GLOVE) ×2
GLOVE ECLIPSE 6.5 STRL STRAW (GLOVE) ×3 IMPLANT
GLOVE ECLIPSE 7.5 STRL STRAW (GLOVE) ×3 IMPLANT
GLOVE ECLIPSE 9.0 STRL (GLOVE) ×3 IMPLANT
GLOVE EXAM NITRILE LRG STRL (GLOVE) IMPLANT
GLOVE EXAM NITRILE MD LF STRL (GLOVE) ×6 IMPLANT
GLOVE EXAM NITRILE XL STR (GLOVE) IMPLANT
GLOVE EXAM NITRILE XS STR PU (GLOVE) IMPLANT
GLOVE INDICATOR 7.5 STRL GRN (GLOVE) ×3 IMPLANT
GOWN STRL REUS W/ TWL LRG LVL3 (GOWN DISPOSABLE) ×2 IMPLANT
GOWN STRL REUS W/ TWL XL LVL3 (GOWN DISPOSABLE) ×1 IMPLANT
GOWN STRL REUS W/TWL 2XL LVL3 (GOWN DISPOSABLE) IMPLANT
GOWN STRL REUS W/TWL LRG LVL3 (GOWN DISPOSABLE) ×7 IMPLANT
GOWN STRL REUS W/TWL XL LVL3 (GOWN DISPOSABLE) ×2
HEMOSTAT POWDER KIT SURGIFOAM (HEMOSTASIS) ×3 IMPLANT
KIT BASIN OR (CUSTOM PROCEDURE TRAY) ×3 IMPLANT
KIT ROOM TURNOVER OR (KITS) ×3 IMPLANT
NEEDLE HYPO 22GX1.5 SAFETY (NEEDLE) ×3 IMPLANT
NEEDLE SPNL 22GX3.5 QUINCKE BK (NEEDLE) ×3 IMPLANT
NS IRRIG 1000ML POUR BTL (IV SOLUTION) ×3 IMPLANT
PACK LAMINECTOMY NEURO (CUSTOM PROCEDURE TRAY) ×3 IMPLANT
PAD ARMBOARD 7.5X6 YLW CONV (MISCELLANEOUS) ×9 IMPLANT
RUBBERBAND STERILE (MISCELLANEOUS) ×6 IMPLANT
SPONGE GAUZE 4X4 12PLY (GAUZE/BANDAGES/DRESSINGS) IMPLANT
SPONGE SURGIFOAM ABS GEL SZ50 (HEMOSTASIS) ×3 IMPLANT
STRIP CLOSURE SKIN 1/2X4 (GAUZE/BANDAGES/DRESSINGS) ×2 IMPLANT
SUT VIC AB 2-0 CT1 18 (SUTURE) ×3 IMPLANT
SUT VIC AB 3-0 SH 8-18 (SUTURE) ×3 IMPLANT
SYR 20ML ECCENTRIC (SYRINGE) ×3 IMPLANT
TOWEL OR 17X24 6PK STRL BLUE (TOWEL DISPOSABLE) IMPLANT
TOWEL OR 17X26 10 PK STRL BLUE (TOWEL DISPOSABLE) ×3 IMPLANT
WATER STERILE IRR 1000ML POUR (IV SOLUTION) ×3 IMPLANT

## 2013-10-21 NOTE — Transfer of Care (Signed)
Immediate Anesthesia Transfer of Care Note  Patient: Sabrina Jacobson  Procedure(s) Performed: Procedure(s): LUMBAR TWO-THREE/FOUR FIVELAMINECTOMY/DECOMPRESSION MICRODISCECTOMY 2 LEVELS (Left)  Patient Location: PACU  Anesthesia Type:General  Level of Consciousness: awake, alert  and oriented  Airway & Oxygen Therapy: Patient Spontanous Breathing and Patient connected to nasal cannula oxygen  Post-op Assessment: Report given to PACU RN and Post -op Vital signs reviewed and stable  Post vital signs: Reviewed and stable  Complications: No apparent anesthesia complications

## 2013-10-21 NOTE — Anesthesia Preprocedure Evaluation (Signed)
Anesthesia Evaluation  Patient identified by MRN, date of birth, ID band Patient awake    Reviewed: Allergy & Precautions, H&P , NPO status , Patient's Chart, lab work & pertinent test results  Airway Mallampati: II  Neck ROM: full    Dental   Pulmonary          Cardiovascular negative cardio ROS      Neuro/Psych  Neuromuscular disease    GI/Hepatic GERD-  ,  Endo/Other  obese  Renal/GU      Musculoskeletal  (+) Arthritis -,   Abdominal   Peds  Hematology   Anesthesia Other Findings   Reproductive/Obstetrics                           Anesthesia Physical Anesthesia Plan  ASA: II  Anesthesia Plan: General   Post-op Pain Management:    Induction: Intravenous  Airway Management Planned: Oral ETT  Additional Equipment:   Intra-op Plan:   Post-operative Plan: Extubation in OR  Informed Consent: I have reviewed the patients History and Physical, chart, labs and discussed the procedure including the risks, benefits and alternatives for the proposed anesthesia with the patient or authorized representative who has indicated his/her understanding and acceptance.     Plan Discussed with: Anesthesiologist, Surgeon and CRNA  Anesthesia Plan Comments:         Anesthesia Quick Evaluation

## 2013-10-21 NOTE — Progress Notes (Signed)
CARE MANAGEMENT NOTE 10/21/2013  Patient:  Sabrina Jacobson, Sabrina Jacobson   Account Number:  [id number]  Date Initiated:  10/21/2013  Documentation initiated by:  Olga Coaster  Subjective/Objective Assessment:   ADMITTED WITH HERNIATED LUMBAR DISC     Action/Plan:   CM FOLLOWING FOR DCP   Anticipated DC Date:  10/26/2013   Anticipated DC Plan:  PATIENT IS FOR SURGERY TODAY; CM WILL CONTINUE TO FOLLOW FOR DCP POST OP     DC Planning Services  CM consult         Status of service:  In process, will continue to follow Medicare Important Message given?   (If response is "NO", the following Medicare IM given date fields will be blank)  Per UR Regulation:  Reviewed for med. necessity/level of care/duration of stay  Comments:  8/3/2015Mindi Slicker RN,BSN,MHA [phone]

## 2013-10-21 NOTE — Progress Notes (Signed)
Patient is transported to OR at this time. Alert and in stable condition.

## 2013-10-21 NOTE — Brief Op Note (Signed)
10/20/2013 - 10/21/2013  6:59 PM  PATIENT:  [name]  48 y.o. female  PRE-OPERATIVE DIAGNOSIS:  Left L2-3 and left L4-5 H&P with radiculopathy  POST-OPERATIVE DIAGNOSIS:  Left L2-3 and left L4-5 H&P with radiculopathy  PROCEDURE:  Procedure(s): LUMBAR TWO-THREE/FOUR FIVELAMINECTOMY/DECOMPRESSION MICRODISCECTOMY 2 LEVELS (Left)  SURGEON:  Surgeon(s) and Role:    * Charlie Pitter, MD - Primary    * Winfield Cunas, MD - Assisting  PHYSICIAN ASSISTANT:   ASSISTANTS:    ANESTHESIA:   general  EBL:  Total I/O In: -  Out: 400 [Blood:400]  BLOOD ADMINISTERED:none  DRAINS: none   LOCAL MEDICATIONS USED:  MARCAINE     SPECIMEN:  No Specimen  DISPOSITION OF SPECIMEN:  N/A  COUNTS:  YES  TOURNIQUET:  * No tourniquets in log *  DICTATION: .Dragon Dictation  PLAN OF CARE: Admit to inpatient   PATIENT DISPOSITION:  PACU - hemodynamically stable.   Delay start of Pharmacological VTE agent (>24hrs) due to surgical blood loss or risk of bleeding: yes

## 2013-10-21 NOTE — Anesthesia Postprocedure Evaluation (Signed)
  Anesthesia Post-op Note  Patient: Sabrina Jacobson  Procedure(s) Performed: Procedure(s): LUMBAR TWO-THREE/FOUR FIVELAMINECTOMY/DECOMPRESSION MICRODISCECTOMY 2 LEVELS (Left)  Patient Location: PACU  Anesthesia Type:General  Level of Consciousness: awake  Airway and Oxygen Therapy: Patient Spontanous Breathing  Post-op Pain: mild  Post-op Assessment: Post-op Vital signs reviewed  Post-op Vital Signs: Reviewed  Last Vitals:  Filed Vitals:   10/21/13 2102  BP: 117/63  Pulse: 56  Temp: 36.6 C  Resp: 16    Complications: No apparent anesthesia complications

## 2013-10-21 NOTE — Op Note (Signed)
Date of procedure: 10/21/2013  Date of dictation: Same  Service: Neurosurgery  Preoperative diagnosis: Left L2-3 herniated nucleus pulposis with radiculopathy and left L4-5 herniated nucleus pulposus with radiculopathy  Postoperative diagnosis: Same  Procedure Name: Left L2-3 laminotomy and microdiscectomy  Left L4-5 laminotomy and microdiscectomy  Surgeon:Nadia Torr A.Magnum Lunde, M.D.  Asst. Surgeon: Christella Noa  Anesthesia: General  Indication: 48 year old female with severe left lower extremity pain. She does and weakness consistent both with a left L3 and left L5 root were pattern. Workup demonstrates evidence of a large left-sided L2-3 and left-sided L4-5 disc herniations with marked compression of the thecal sac and nerve roots. Patient presents now for two-level microdiscectomy  Operative note: After induction of anesthesia, patient positioned prone on the Wilson frame intercarpal a padded. Patient's lumbar region prepped and draped. Incision made overlying L3. Subperiosteal dissection performed on the left side at L2-3 and at L4-5. Retractor placed. X-rays taken. Levels confirmed. Laminotomy performed using high-speed drill and Kerrison rongeurs to remove the inferior aspect of lamina of L2 medial aspect the L2-3 facet joint superior rim of the L3 lamina as well as the inferior aspect of L4 lamina medial aspect the L4-5 facet joint in the superior rim of the L5 lamina. Ligament flavum elevator second piece of fashion using Kerrison rongeurs. Underlying thecal sac and exiting L3 nerve root and L5 nerve roots were identified. Microscope brought in the field using microdissection starting first at the left L2-3 level. Epidural venous plexus coagulated and cut the thecal sac and L3 nerve root were gently mobilized and retracted towards midline. A large amount of inferiorly migrated disc herniation was encountered. This was dissected free and removed using pituitary rongeurs. All elements the disc  herniation were resected. There is no evidence of injury thecal sac and nerve roots. The disc space itself was quite flat with no major annular defect. Wheezing. Isolation. Gelfoam placed top of hemostasis. Tension and placed L4-5. The thecal sac and L5 nerve root were gently mobilized and retracted towards midline. A large free disc herniation just inferior to the disc space at L4-5 was encountered. This was dissected free and removed. There were no other fragments present. The disc space itself was flat with a small annular defect from which nothing was protruding. At this point the wound is irrigated by solution. Gelfoam was placed after hemostasis was found to get. Microscope her fractures were removed. Hemostasis musculature was then closed in layers with Vicryl sutures. Steri-Strips triggers were applied. No apparent complications. Patient tolerated procedure well and she returned to the recovery postop.

## 2013-10-21 NOTE — Interval H&P Note (Signed)
History and Physical Interval Note:  10/21/2013 4:35 PM  Sharlie Shreffler  has presented today for surgery, with the diagnosis of Left L2-3 and left L4-5 H&P with radiculopathy  The various methods of treatment have been discussed with the patient and family. After consideration of risks, benefits and other options for treatment, the patient has consented to  Procedure(s): LUMBAR LAMINECTOMY/DECOMPRESSION MICRODISCECTOMY 2 LEVELS (Left) as a surgical intervention .  The patient's history has been reviewed, patient examined, no change in status, stable for surgery.  I have reviewed the patient's chart and labs.  Questions were answered to the patient's satisfaction.     Niasha Devins A

## 2013-10-21 NOTE — Anesthesia Procedure Notes (Signed)
Procedure Name: Intubation Date/Time: 10/21/2013 5:25 PM Performed by: Eligha Bridegroom Pre-anesthesia Checklist: Patient identified, Patient being monitored, Emergency Drugs available, Timeout performed and Suction available Patient Re-evaluated:Patient Re-evaluated prior to inductionOxygen Delivery Method: Circle system utilized Preoxygenation: Pre-oxygenation with 100% oxygen Intubation Type: IV induction Ventilation: Mask ventilation without difficulty Laryngoscope Size: Mac and 3 Grade View: Grade I Tube type: Oral Tube size: 7.0 mm Number of attempts: 1 Airway Equipment and Method: Stylet Placement Confirmation: ETT inserted through vocal cords under direct vision,  breath sounds checked- equal and bilateral and positive ETCO2 Secured at: 21 cm Tube secured with: Tape Dental Injury: Teeth and Oropharynx as per pre-operative assessment

## 2013-10-22 MED ORDER — CYCLOBENZAPRINE HCL 10 MG PO TABS: 10.0000 mg | ORAL_TABLET | Freq: Three times a day (TID) | ORAL | Status: DC | PRN

## 2013-10-22 MED ORDER — HYDROCODONE-ACETAMINOPHEN 5-325 MG PO TABS: 1.0000 | ORAL_TABLET | ORAL | Status: DC | PRN

## 2013-10-22 NOTE — Evaluation (Signed)
Physical Therapy Evaluation Patient Details Name: Sabrina Jacobson MRN: [id number] DOB: 1965/09/14 Today's Date: 10/22/2013   History of Present Illness  Admitted for decompressive lami's and microdiscketomies to L 45 and L34  Clinical Impression  All education has been completed, pt at supervision level and should quickly progress.   She has good family assist lined up.  No further PT needs.  Will Sign off.    Follow Up Recommendations No PT follow up;Supervision for mobility/OOB    Equipment Recommendations  None recommended by PT    Recommendations for Other Services       Precautions / Restrictions Precautions Precautions: Back Restrictions Weight Bearing Restrictions: No      Mobility  Bed Mobility Overal bed mobility: Needs Assistance Bed Mobility: Rolling;Sidelying to Sit;Sit to Sidelying Rolling: Supervision Sidelying to sit: Supervision     Sit to sidelying: Supervision General bed mobility comments: Educated in log roll and side to sit; no assist or rail needed  Transfers Overall transfer level: Needs assistance   Transfers: Sit to/from Stand;Stand Pivot Transfers Sit to Stand: Supervision Stand pivot transfers: Supervision       General transfer comment: cues for hand placement and general safety  Ambulation/Gait Ambulation/Gait assistance: Supervision Ambulation Distance (Feet): 250 Feet Assistive device: Rolling walker (2 wheeled) Gait Pattern/deviations: Step-through pattern Gait velocity: slower and guarded   General Gait Details: steady, but pt experiencing sensation of hyperextending  and foot not flat on L leg.  Stairs Stairs: Yes Stairs assistance: Supervision Stair Management: One rail Left;Step to pattern;Forwards Number of Stairs: 5 General stair comments: steady with rail  Wheelchair Mobility    Modified Rankin (Stroke Patients Only)       Balance Overall balance assessment: No apparent balance deficits (not formally  assessed)                                           Pertinent Vitals/Pain Some incisional pain, no meds needed.    Home Living Family/patient expects to be discharged to:: Private residence Living Arrangements: Spouse/significant other;Children Available Help at Discharge: Family Type of Home: House Home Access: Stairs to enter Entrance Stairs-Rails: None Entrance Stairs-Number of Steps: 1 Home Layout: Two level;Able to live on main level with bedroom/bathroom Home Equipment: Gilford Rile - 2 wheels;Cane - single point;Bedside commode;Tub bench;Wheelchair - manual      Prior Function Level of Independence: Independent               Hand Dominance        Extremity/Trunk Assessment   Upper Extremity Assessment: Defer to OT evaluation           Lower Extremity Assessment: Overall WFL for tasks assessed;LLE deficits/detail         Communication   Communication: No difficulties  Cognition Arousal/Alertness: Awake/alert Behavior During Therapy: WFL for tasks assessed/performed Overall Cognitive Status: Within Functional Limits for tasks assessed                      General Comments General comments (skin integrity, edema, etc.): educated in all ADVISED back care/prec. log roll, lifting precautions and typical progression of activity.    Exercises        Assessment/Plan    PT Assessment Patent does not need any further PT services  PT Diagnosis     PT Problem List    PT Treatment Interventions  PT Goals (Current goals can be found in the Care Plan section) Acute Rehab PT Goals PT Goal Formulation: No goals set, d/c therapy    Frequency     Barriers to discharge        Co-evaluation               End of Session   Activity Tolerance: Patient tolerated treatment well Patient left: in chair;with call bell/phone within reach;with family/visitor present Nurse Communication: Mobility status         Time:  5027-7412 PT Time Calculation (min): 32 min   Charges:   PT Evaluation $Initial PT Evaluation Tier I: 1 Procedure PT Treatments $Gait Training: 8-22 mins $Self Care/Home Management: 8-22   PT G Codes:          Tashayla Therien, Tessie Fass 10/22/2013, 10:39 AM 10/22/2013  Donnella Sham, PT [phone]  (pager)

## 2013-10-22 NOTE — Discharge Instructions (Signed)

## 2013-10-22 NOTE — Discharge Summary (Signed)
Physician Discharge Summary  Patient ID: Sabrina Jacobson MRN: [id number] DOB/AGE: Jul 17, 1965 48 y.o.  Admit date: 10/20/2013 Discharge date: 10/22/2013  Admission Diagnoses:  Discharge Diagnoses:  Active Problems:   Herniated lumbar intervertebral disc   Lumbar disc herniation with radiculopathy   Discharged Condition: good  Hospital Course: The patient was admitted to the hospital for evaluation and treatment of severe back pain with right lower extremity radiculopathy. Workup demonstrated evidence of 2 large symptomatic disc herniations the first at L2-3 on the left and the second and L4-5 on the left. Patient subsequently underwent left-sided L2-3 and left-sided L4-5 laminotomy microdiscectomy. Postoperative she is doing very well. Preoperative back and leg pain very much improved. She standing and walking without difficulty and ready for discharge home.  Consults:   Significant Diagnostic Studies:   Treatments:   Discharge Exam: Blood pressure 132/71, pulse 93, temperature 98.6 F (37 C), temperature source Oral, resp. rate 18, height 5\' 3"  (1.6 m), weight 87.136 kg (192 lb 1.6 oz), SpO2 96.00%. Awake and alert. Oriented and appropriate. Cranial nerve function intact. Motor sensory function reveal 4+ over 5 left quadriceps muscle strength 3/5 left EHL and 4+ over 5 anterior tibialis. Wound clean and dry. Chest and abdomen benign.  Disposition: 01-Home or Self Care     Medication List         cholecalciferol 1000 UNITS tablet  Commonly known as:  VITAMIN D  Take 1,000 Units by mouth daily.     cyclobenzaprine 10 MG tablet  Commonly known as:  FLEXERIL  Take 1 tablet (10 mg total) by mouth 3 (three) times daily as needed for muscle spasms.     ENBREL 50 MG/ML injection  Generic drug:  etanercept  Inject 50 mg into the skin once a week.     folic acid 1 MG tablet  Commonly known as:  FOLVITE  Take 1 mg by mouth daily.     HYDROcodone-acetaminophen 5-325 MG per tablet   Commonly known as:  NORCO/VICODIN  Take 1-2 tablets by mouth every 4 (four) hours as needed for moderate pain.     methotrexate 2.5 MG tablet  Commonly known as:  RHEUMATREX  Take 12.5 mg by mouth once a week. Caution:Chemotherapy. Protect from light.     multivitamin with minerals Tabs tablet  Take 1 tablet by mouth daily.     omeprazole 40 MG capsule  Commonly known as:  PRILOSEC  Take 40 mg by mouth daily.     predniSONE 10 MG tablet  Commonly known as:  DELTASONE  Take 10 mg by mouth every morning.     predniSONE 5 MG tablet  Commonly known as:  DELTASONE  Take 5 mg by mouth daily with breakfast.     traMADol 50 MG tablet  Commonly known as:  ULTRAM  Take 50 mg by mouth every 6 (six) hours as needed for moderate pain.     vitamin E 400 UNIT capsule  Take 400 Units by mouth daily.           Follow-up Information   Call Charlie Pitter, MD.   Specialty:  Neurosurgery   Contact information:   [address] [place] [address] [phone]       Signed: Earnie Larsson A 10/22/2013, 10:43 AM

## 2013-10-22 NOTE — Progress Notes (Signed)
Discharge orders received, pt for discharge home today. IV D/C with dressing CDI to lower back.  D/C instructions and Rx given with verbalized understanding.  Family at bedside to assist pt with discharge. Staff brought pt downstairs via wheelchair.
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
## 2013-10-24 ENCOUNTER — Encounter (HOSPITAL_COMMUNITY): Payer: Self-pay | Admitting: Neurosurgery

## 2014-05-15 ENCOUNTER — Inpatient Hospital Stay (HOSPITAL_COMMUNITY)
Admission: EM | Admit: 2014-05-15 | Discharge: 2014-05-17 | DRG: 552 | Disposition: A | Discharge status: Home / Self Care | Payer: BLUE CROSS/BLUE SHIELD | Source: Other Acute Inpatient Hospital | Attending: Neurosurgery | Admitting: Neurosurgery

## 2014-05-15 ENCOUNTER — Encounter (HOSPITAL_COMMUNITY): Payer: Self-pay | Admitting: *Deleted

## 2014-05-15 DIAGNOSIS — M1990 Unspecified osteoarthritis, unspecified site: Secondary | ICD-10-CM | POA: Diagnosis present

## 2014-05-15 DIAGNOSIS — S32019A Unspecified fracture of first lumbar vertebra, initial encounter for closed fracture: Secondary | ICD-10-CM

## 2014-05-15 DIAGNOSIS — Y92488 Other paved roadways as the place of occurrence of the external cause: Secondary | ICD-10-CM

## 2014-05-15 DIAGNOSIS — S32011A Stable burst fracture of first lumbar vertebra, initial encounter for closed fracture: Secondary | ICD-10-CM | POA: Diagnosis present

## 2014-05-15 DIAGNOSIS — Z880 Allergy status to penicillin: Secondary | ICD-10-CM | POA: Diagnosis not present

## 2014-05-15 DIAGNOSIS — Z885 Allergy status to narcotic agent status: Secondary | ICD-10-CM

## 2014-05-15 DIAGNOSIS — S32001A Stable burst fracture of unspecified lumbar vertebra, initial encounter for closed fracture: Secondary | ICD-10-CM | POA: Diagnosis present

## 2014-05-15 MED ORDER — METHOCARBAMOL 1000 MG/10ML IJ SOLN: 500.0000 mg | Freq: Four times a day (QID) | INTRAVENOUS | Status: DC | PRN

## 2014-05-15 MED ORDER — MORPHINE SULFATE 2 MG/ML IJ SOLN: 1.0000 mg | INTRAMUSCULAR | Status: DC | PRN

## 2014-05-15 MED ORDER — SODIUM CHLORIDE 0.9 % IJ SOLN
3.0000 mL | Freq: Two times a day (BID) | INTRAMUSCULAR | Status: DC
  Administered 2014-05-15: 3 mL via INTRAVENOUS

## 2014-05-15 MED ORDER — BISACODYL 10 MG RE SUPP: 10.0000 mg | Freq: Every day | RECTAL | Status: DC | PRN

## 2014-05-15 MED ORDER — SODIUM CHLORIDE 0.9 % IJ SOLN: 3.0000 mL | INTRAMUSCULAR | Status: DC | PRN

## 2014-05-15 MED ORDER — ACETAMINOPHEN 650 MG RE SUPP: 650.0000 mg | RECTAL | Status: DC | PRN

## 2014-05-15 MED ORDER — SENNA 8.6 MG PO TABS
1.0000 | ORAL_TABLET | Freq: Two times a day (BID) | ORAL | Status: DC
  Administered 2014-05-15 – 2014-05-17 (×5): 8.6 mg via ORAL
  Filled 2014-05-15 (×5): qty 1

## 2014-05-15 MED ORDER — OXYCODONE-ACETAMINOPHEN 5-325 MG PO TABS
1.0000 | ORAL_TABLET | ORAL | Status: DC | PRN
  Administered 2014-05-15: 2 via ORAL
  Administered 2014-05-15: 1 via ORAL
  Administered 2014-05-15 – 2014-05-17 (×6): 2 via ORAL
  Administered 2014-05-17: 1 via ORAL
  Administered 2014-05-17: 2 via ORAL
  Filled 2014-05-15: qty 2
  Filled 2014-05-15: qty 1
  Filled 2014-05-15 (×4): qty 2
  Filled 2014-05-15: qty 1
  Filled 2014-05-15 (×3): qty 2

## 2014-05-15 MED ORDER — SODIUM CHLORIDE 0.9 % IV SOLN: 250.0000 mL | INTRAVENOUS | Status: DC

## 2014-05-15 MED ORDER — POTASSIUM CHLORIDE IN NACL 20-0.9 MEQ/L-% IV SOLN
INTRAVENOUS | Status: DC
  Administered 2014-05-15 – 2014-05-16 (×3): via INTRAVENOUS
  Filled 2014-05-15 (×3): qty 1000

## 2014-05-15 MED ORDER — LEVOFLOXACIN 500 MG PO TABS
500.0000 mg | ORAL_TABLET | Freq: Every day | ORAL | Status: DC
  Administered 2014-05-15 – 2014-05-17 (×3): 500 mg via ORAL
  Filled 2014-05-15 (×3): qty 1

## 2014-05-15 MED ORDER — METHOCARBAMOL 500 MG PO TABS
500.0000 mg | ORAL_TABLET | Freq: Four times a day (QID) | ORAL | Status: DC | PRN
  Administered 2014-05-15 – 2014-05-17 (×9): 500 mg via ORAL
  Filled 2014-05-15 (×9): qty 1

## 2014-05-15 MED ORDER — ACETAMINOPHEN 325 MG PO TABS: 650.0000 mg | ORAL_TABLET | ORAL | Status: DC | PRN

## 2014-05-15 MED ORDER — ONDANSETRON HCL 4 MG/2ML IJ SOLN
4.0000 mg | INTRAMUSCULAR | Status: DC | PRN
  Administered 2014-05-16: 4 mg via INTRAVENOUS
  Filled 2014-05-15: qty 2

## 2014-05-15 MED ORDER — DOCUSATE SODIUM 100 MG PO CAPS
100.0000 mg | ORAL_CAPSULE | Freq: Two times a day (BID) | ORAL | Status: DC
  Administered 2014-05-15 – 2014-05-17 (×5): 100 mg via ORAL
  Filled 2014-05-15 (×5): qty 1
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
## 2014-05-15 NOTE — Progress Notes (Addendum)
PT Cancellation Note  Patient Details Name: Sabrina Jacobson MRN: [id number] DOB: 1966-01-29   Cancelled Treatment:    Reason Eval/Treat Not Completed: Patient at procedure or test/unavailable. With another discipline. Brace is in room. Will attempt to see later today   Kyiah Canepa 05/15/2014, 2:53 PM Pager [phone]  15:25 addendum--Initiated evaluation. As donning pt's TLSO, I am concerned that her brace is too short to support L1. Spoke with Louie Casa at [facility] and he is going to come by to check the fit.    05/15/2014 Barry Brunner, PT Pager: [phone]
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
## 2014-05-15 NOTE — Progress Notes (Signed)
Orthopedic Tech Progress Note Patient Details:  Sabrina Jacobson 07-Nov-1965 [id number]  Patient ID: [name], female   DOB: Aug 24, 1965, 49 y.o.   MRN: [id number] Called in bio-tech brace order; spoke with Ramonita Lab, Jeris Roser 05/15/2014, 9:37 AM

## 2014-05-15 NOTE — Progress Notes (Signed)
Pt arrived from [facility] with a C Collar and laying on a board, pt settled in bed, Dr [name] on call paged and notified for orders, ordered to remove C Collar and the board for now,pt reassured, call light at bedside, will however continue to monitor. [place]-Asidi, Emillie Chasen Efe

## 2014-05-16 ENCOUNTER — Inpatient Hospital Stay (HOSPITAL_COMMUNITY): Payer: BLUE CROSS/BLUE SHIELD
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
## 2014-05-17 MED ORDER — CYCLOBENZAPRINE HCL 10 MG PO TABS: 10.0000 mg | ORAL_TABLET | Freq: Three times a day (TID) | ORAL | Status: DC | PRN

## 2014-05-17 MED ORDER — OXYCODONE-ACETAMINOPHEN 5-325 MG PO TABS: 1.0000 | ORAL_TABLET | ORAL | Status: DC | PRN

## 2014-05-17 MED ORDER — ALUM & MAG HYDROXIDE-SIMETH 200-200-20 MG/5ML PO SUSP: 30.0000 mL | Freq: Four times a day (QID) | ORAL | Status: DC | PRN

## 2014-05-17 MED ORDER — BISACODYL 10 MG RE SUPP: 10.0000 mg | Freq: Every day | RECTAL | Status: DC | PRN
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
## 2014-05-17 NOTE — Progress Notes (Signed)
Pt d/ced home, all instructions and prescriptions are given. Pain treated prior to d/c. Passing gas no BM, MD aware. No concerns or questions.  Sabrina Jacobson

## 2014-05-17 NOTE — Discharge Instructions (Signed)
Patient need to be off from work, until cleared by neurosurgeon.

## 2014-05-17 NOTE — Plan of Care (Signed)
Problem: Acute Rehab OT Goals (only OT should resolve) Goal: Pt. Will Perform Tub/Shower Transfer Outcome: Not Met (add Reason) Pt. Will sponge bathe initially then use tub bench
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
## 2015-12-07 IMAGING — CR DG THORACOLUMBAR SPINE 2V
4 series · 4 of 4 positions shown · non-contrast
Comparison: CT scan of February 14, 2012.

CLINICAL DATA: L1 vertebral fracture.

EXAM:
THORACOLUMBAR SPINE 1V

[w thoraco-lumbar junction ap (1 of 4)]
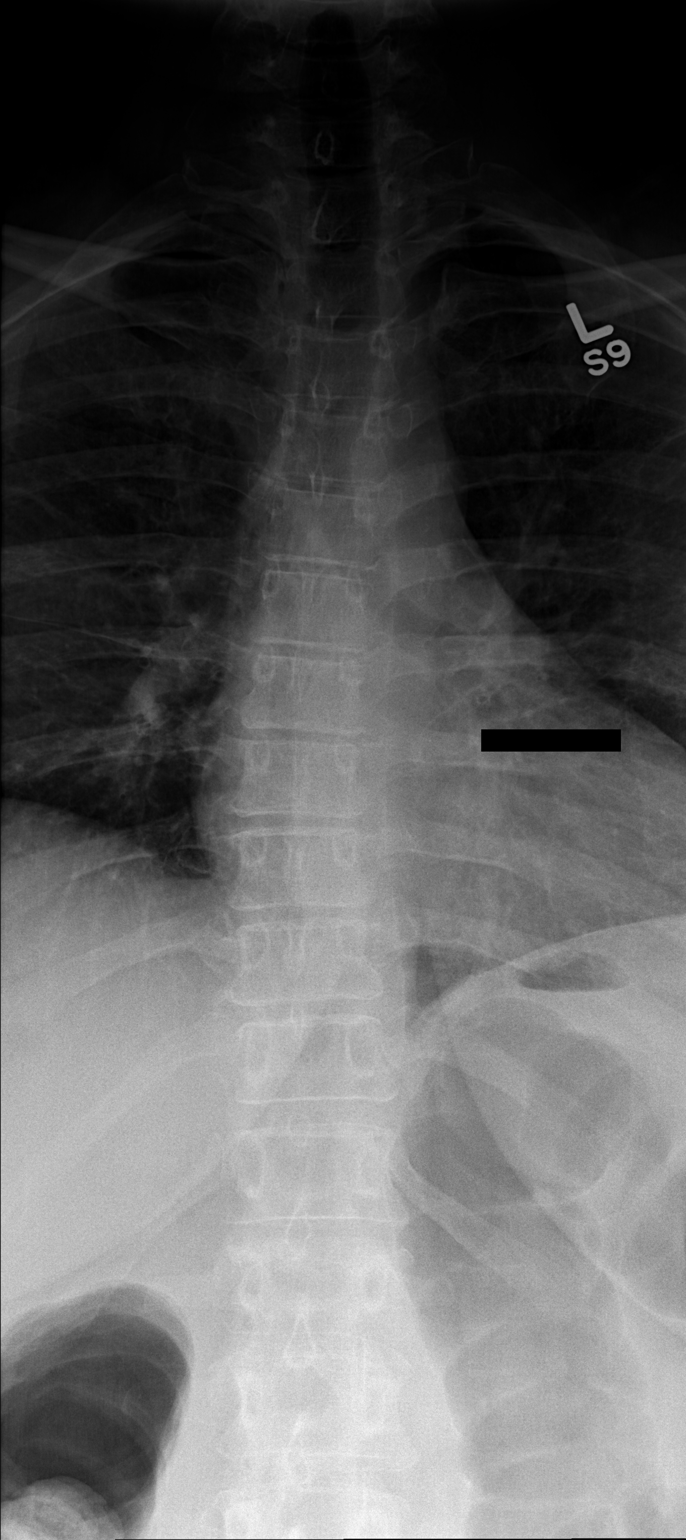

[w thoraco-lumbar junction ap (2 of 4)]
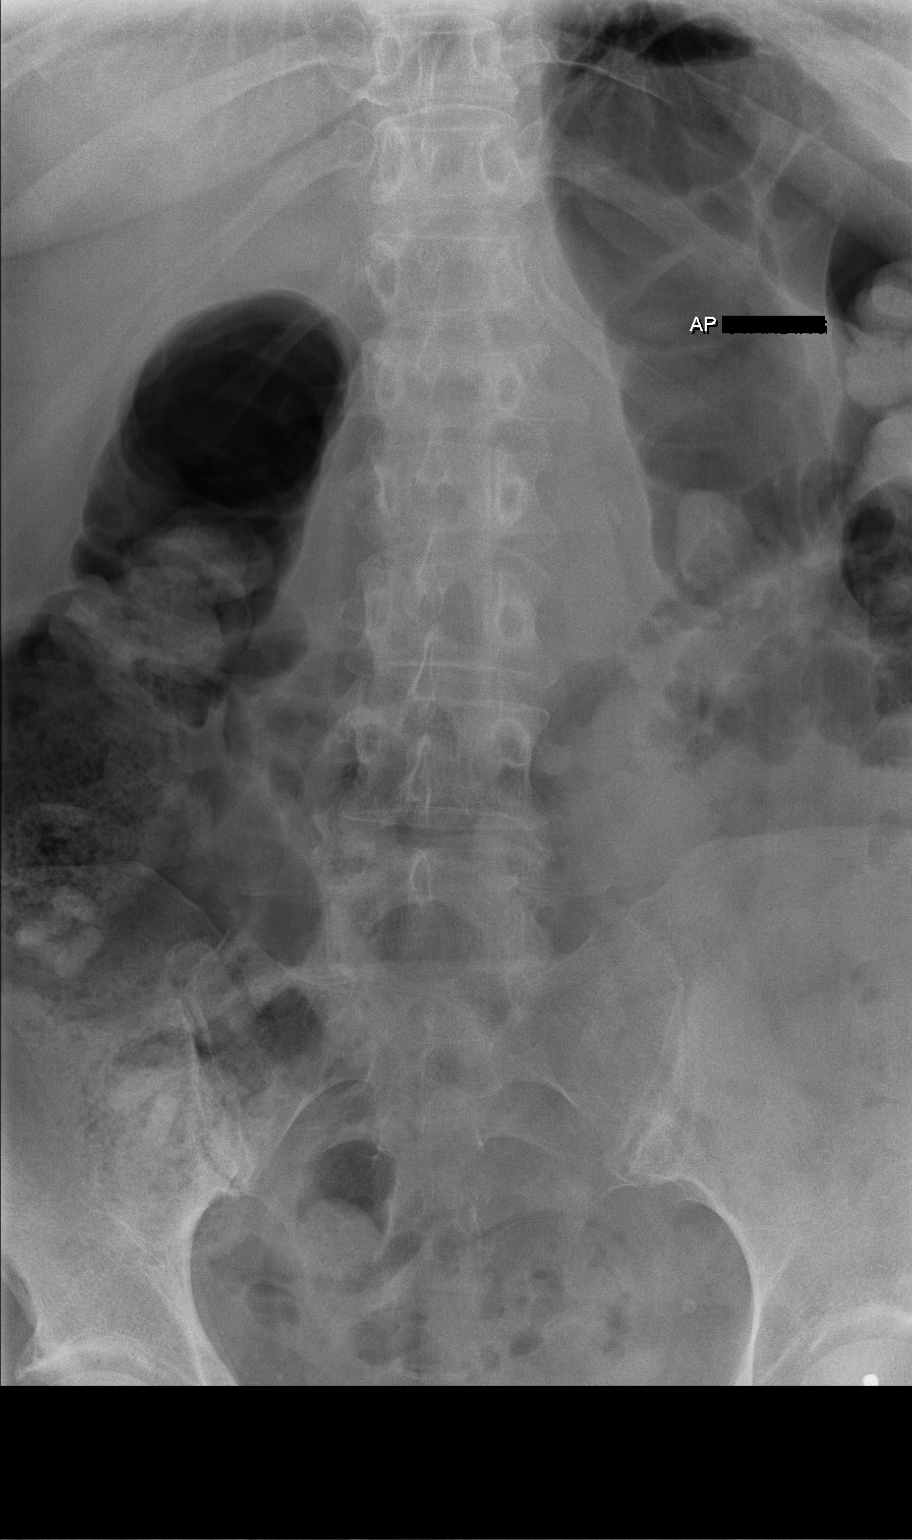

[w thoraco-lumbar junction ap (3 of 4)]
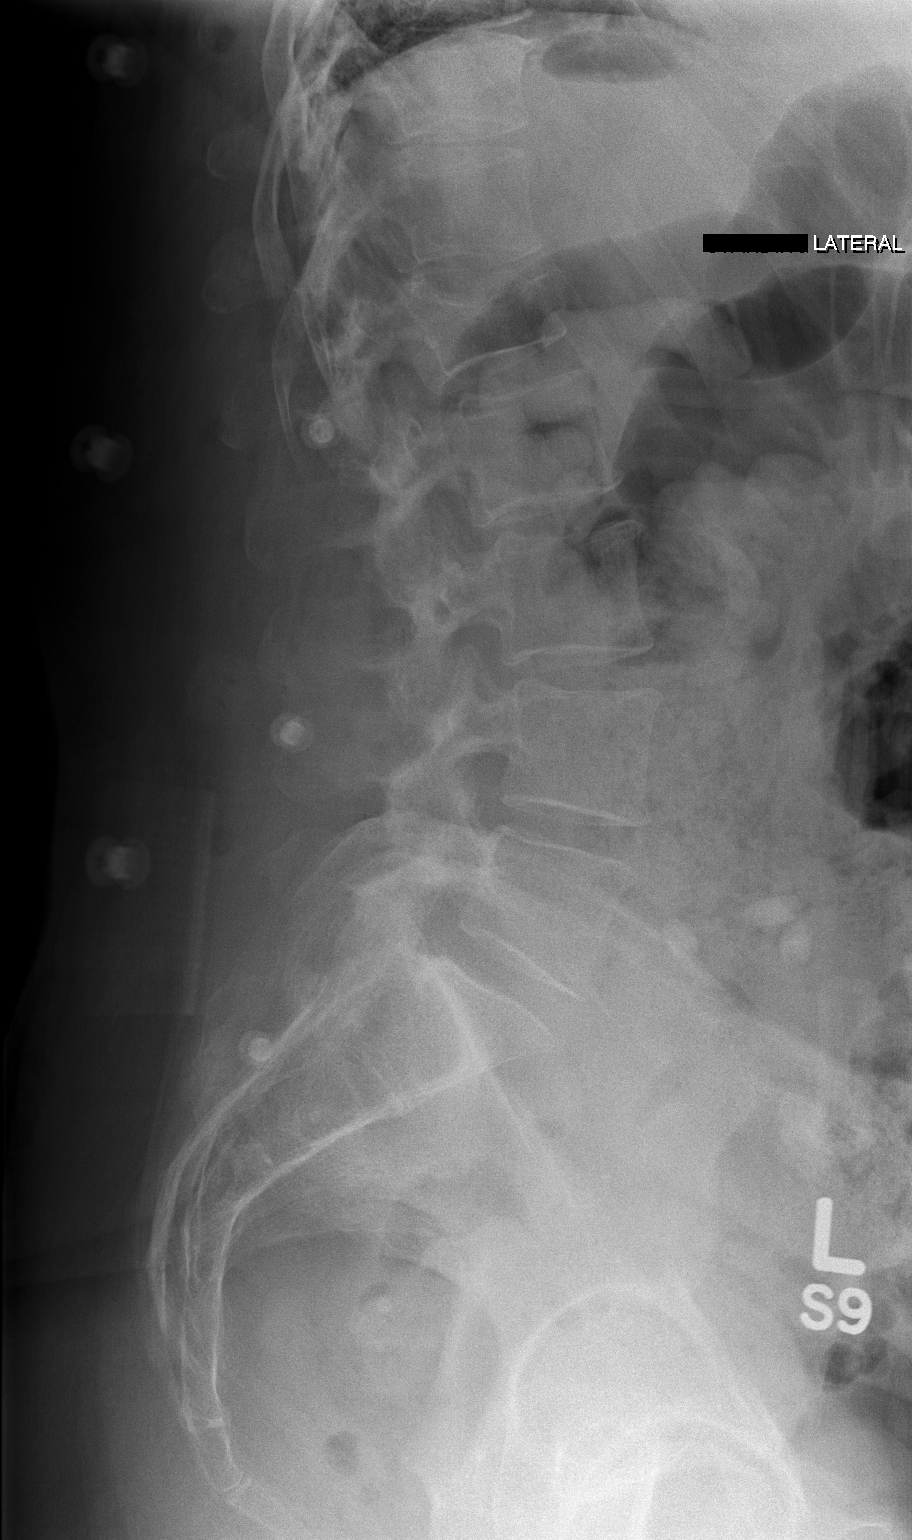

[w thoraco-lumbar junction ap (4 of 4)]
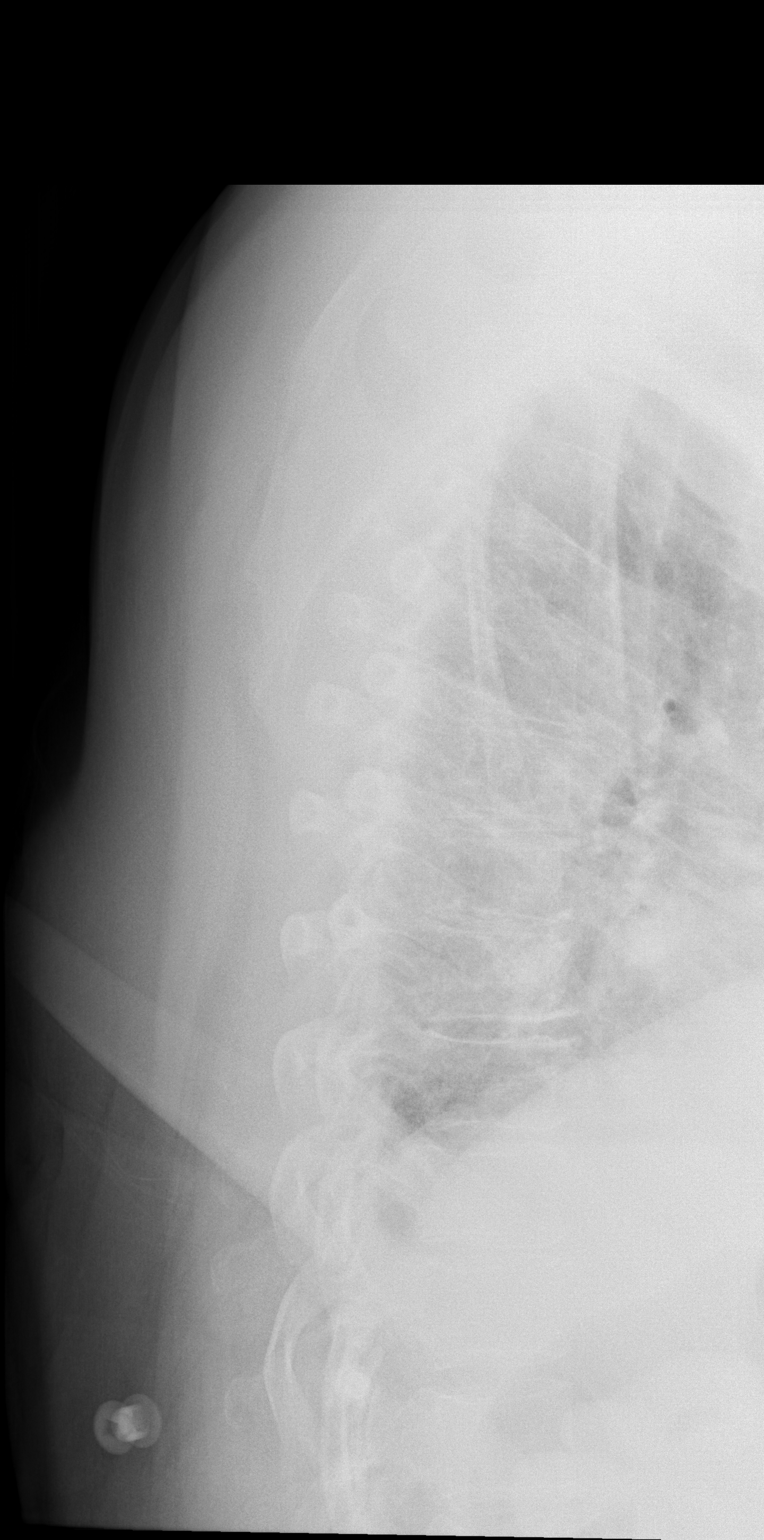

[4 of 4 positions shown; findings below may reference images not displayed]

FINDINGS: Mild dextroscoliosis of lower thoracic spine is noted. Moderate
wedge compression deformity of L1 vertebral body is noted consistent
with fracture of indeterminate age. It was not present on the prior
CT scan of 7060. Disc spaces appear to be well maintained. No other
evidence of fracture or spondylolisthesis is noted.
IMPRESSION: Moderate wedge compression deformity of L1 vertebral body is noted
consistent with fracture of indeterminate age. No other significant
abnormality is noted in the thoracic or lumbar spine.

## 2019-06-25 ENCOUNTER — Encounter: Payer: Self-pay | Admitting: Internal Medicine

## 2019-08-16 ENCOUNTER — Ambulatory Visit (AMBULATORY_SURGERY_CENTER): Payer: BLUE CROSS/BLUE SHIELD | Admitting: *Deleted

## 2019-08-16 ENCOUNTER — Other Ambulatory Visit: Payer: Self-pay

## 2019-08-16 VITALS — Ht 62.0 in | Wt 173.0 lb

## 2019-08-16 DIAGNOSIS — Z01818 Encounter for other preprocedural examination: Secondary | ICD-10-CM

## 2019-08-16 DIAGNOSIS — Z1211 Encounter for screening for malignant neoplasm of colon: Secondary | ICD-10-CM

## 2019-08-16 MED ORDER — SUTAB 1479-225-188 MG PO TABS: 1.0000 | ORAL_TABLET | ORAL | 0 refills | Status: DC

## 2019-08-16 NOTE — Progress Notes (Signed)
Patient's pre-visit was done today over the phone with the patient due to COVID-19 pandemic. Name,DOB and address verified. Insurance verified. Packet of Prep instructions mailed to patient including copy of a consent form -pt is aware. Patient understands to call us back with any questions or concerns.  Pt is aware that care partner will wait in the car during procedure; if they feel like they will be too hot or cold to wait in the car; they may wait in the 4 th floor lobby. Patient is aware to bring only one care partner. We want them to wear a mask (we do not have any that we can provide them), practice social distancing, and we will check their temperatures when they get here.  I did remind the patient that their care partner needs to stay in the parking lot the entire time and have a cell phone available, we will call them when the pt is ready for discharge. Patient will wear mask into building.  COVID-19 screening test is on 08/29/2019, the pt is aware.   [employer] Prep Prescription coupon given to the patient.

## 2019-08-22 ENCOUNTER — Encounter: Payer: Self-pay | Admitting: Internal Medicine

## 2019-08-29 ENCOUNTER — Ambulatory Visit (INDEPENDENT_AMBULATORY_CARE_PROVIDER_SITE_OTHER): Payer: BC Managed Care – PPO

## 2019-08-29 ENCOUNTER — Other Ambulatory Visit: Payer: Self-pay | Admitting: Internal Medicine

## 2019-08-29 DIAGNOSIS — Z1159 Encounter for screening for other viral diseases: Secondary | ICD-10-CM

## 2019-08-29 LAB — SARS CORONAVIRUS 2 (TAT 6-24 HRS): SARS Coronavirus 2: NEGATIVE

## 2019-09-02 ENCOUNTER — Ambulatory Visit (AMBULATORY_SURGERY_CENTER): Payer: BC Managed Care – PPO | Admitting: Internal Medicine

## 2019-09-02 ENCOUNTER — Other Ambulatory Visit: Payer: Self-pay

## 2019-09-02 ENCOUNTER — Encounter: Payer: Self-pay | Admitting: Internal Medicine

## 2019-09-02 VITALS — BP 106/56 | HR 65 | Temp 97.8°F | Resp 12 | Ht 62.0 in | Wt 173.0 lb

## 2019-09-02 DIAGNOSIS — K635 Polyp of colon: Secondary | ICD-10-CM

## 2019-09-02 DIAGNOSIS — Z1211 Encounter for screening for malignant neoplasm of colon: Secondary | ICD-10-CM

## 2019-09-02 DIAGNOSIS — D122 Benign neoplasm of ascending colon: Secondary | ICD-10-CM

## 2019-09-02 MED ORDER — SODIUM CHLORIDE 0.9 % IV SOLN: 500.0000 mL | Freq: Once | INTRAVENOUS | Status: DC

## 2019-09-02 NOTE — Progress Notes (Signed)
Pt's states no medical or surgical changes since previsit or office visit.  Temp- June Vitals- Courtney 

## 2019-09-02 NOTE — Progress Notes (Signed)
Report to PACU, RN, vss, BBS= Clear.  

## 2019-09-02 NOTE — Patient Instructions (Signed)
Impression/Recommendations:  Polyp handout given to patient. Diverticulosis handout given to patient.  Repeat colonoscopy in 5 or 10 years.  Date to be determined after pathology results reviewed.  Resume previous diet. Continue present medications. Await pathology results.  YOU HAD AN ENDOSCOPIC PROCEDURE TODAY AT [facility]:   Refer to the procedure report that was given to you for any specific questions about what was found during the examination.  If the procedure report does not answer your questions, please call your gastroenterologist to clarify.  If you requested that your care partner not be given the details of your procedure findings, then the procedure report has been included in a sealed envelope for you to review at your convenience later.  YOU SHOULD EXPECT: Some feelings of bloating in the abdomen. Passage of more gas than usual.  Walking can help get rid of the air that was put into your GI tract during the procedure and reduce the bloating. If you had a lower endoscopy (such as a colonoscopy or flexible sigmoidoscopy) you may notice spotting of blood in your stool or on the toilet paper. If you underwent a bowel prep for your procedure, you may not have a normal bowel movement for a few days.  Please Note:  You might notice some irritation and congestion in your nose or some drainage.  This is from the oxygen used during your procedure.  There is no need for concern and it should clear up in a day or so.  SYMPTOMS TO REPORT IMMEDIATELY:   Following lower endoscopy (colonoscopy or flexible sigmoidoscopy):  Excessive amounts of blood in the stool  Significant tenderness or worsening of abdominal pains  Swelling of the abdomen that is new, acute  Fever of 100F or higher For urgent or emergent issues, a gastroenterologist can be reached at any hour by calling [phone]. Do not use MyChart messaging for urgent concerns.    DIET:  We do recommend a  small meal at first, but then you may proceed to your regular diet.  Drink plenty of fluids but you should avoid alcoholic beverages for 24 hours.  ACTIVITY:  You should plan to take it easy for the rest of today and you should NOT DRIVE or use heavy machinery until tomorrow (because of the sedation medicines used during the test).    FOLLOW UP: Our staff will call the number listed on your records 48-72 hours following your procedure to check on you and address any questions or concerns that you may have regarding the information given to you following your procedure. If we do not reach you, we will leave a message.  We will attempt to reach you two times.  During this call, we will ask if you have developed any symptoms of COVID 19. If you develop any symptoms (ie: fever, flu-like symptoms, shortness of breath, cough etc.) before then, please call [phone].  If you test positive for Covid 19 in the 2 weeks post procedure, please call and report this information to [place].    If any biopsies were taken you will be contacted by phone or by letter within the next 1-3 weeks.  Please call us at [phone] if you have not heard about the biopsies in 3 weeks.    SIGNATURES/CONFIDENTIALITY: You and/or your care partner have signed paperwork which will be entered into your electronic medical record.  These signatures attest to the fact that that the information above on your After Visit Summary has been  reviewed and is understood.  Full responsibility of the confidentiality of this discharge information lies with you and/or your care-partner. 

## 2019-09-02 NOTE — Progress Notes (Signed)
Pt. Felt mildly crampy.  Has been passing air in small increments.  Given Levsin 0.25 mg sl to assist in passing air.  Pt.denies pain.  Abdomen soft.  Pt. Had minimal expelling of air in the restroom. Comfortable upon discharge. Told pt. To rock on hands and knees at home, have hot drinks, and walk to assist in expelling air at home.  Encouraged pt. To eat only a small meal, so as not to contribute to any abdominal discomfort.

## 2019-09-02 NOTE — Progress Notes (Signed)
Called to room to assist during endoscopic procedure.  Patient ID and intended procedure confirmed with present staff. Received instructions for my participation in the procedure from the performing physician.  

## 2019-09-02 NOTE — Op Note (Signed)
[facility] Patient Name: Sabrina Jacobson Procedure Date: 09/02/2019 10:53 AM MRN: [id number] Endoscopist: Docia Chuck. Henrene Pastor , MD Age: 54 Referring MD:  Date of Birth: [birth year] Gender: Female Account #: [id number] Procedure:                Colonoscopy with cold snare polypectomy x 1 Indications:              Screening for colorectal malignant neoplasm Medicines:                Monitored Anesthesia Care Procedure:                Pre-Anesthesia Assessment:                           - Prior to the procedure, a History and Physical                            was performed, and patient medications and                            allergies were reviewed. The patient's tolerance of                            previous anesthesia was also reviewed. The risks                            and benefits of the procedure and the sedation                            options and risks were discussed with the patient.                            All questions were answered, and informed consent                            was obtained. Prior Anticoagulants: The patient has                            taken no previous anticoagulant or antiplatelet                            agents. ASA Grade Assessment: II - A patient with                            mild systemic disease. After reviewing the risks                            and benefits, the patient was deemed in                            satisfactory condition to undergo the procedure.                           After obtaining informed consent, the colonoscope  was passed under direct vision. Throughout the                            procedure, the patient's blood pressure, pulse, and                            oxygen saturations were monitored continuously. The                            Colonoscope was introduced through the anus and                            advanced to the the cecum, identified by                             appendiceal orifice and ileocecal valve. The                            ileocecal valve, appendiceal orifice, and rectum                            were photographed. The quality of the bowel                            preparation was excellent. The colonoscopy was                            performed without difficulty. The patient tolerated                            the procedure well. The bowel preparation used was                            SUPREP via split dose instruction. Scope In: 11:01:22 AM Scope Out: 11:16:28 AM Scope Withdrawal Time: 0 hours 9 minutes 58 seconds  Total Procedure Duration: 0 hours 15 minutes 6 seconds  Findings:                 A 5 mm polyp was found in the ascending colon. The                            polyp was sessile. The polyp was removed with a                            cold snare. Resection and retrieval were complete.                           Multiple diverticula were found in the sigmoid                            colon. There was stenosis at the rectosigmoid                            junction.  The exam was otherwise without abnormality on                            direct and retroflexion views. Complications:            No immediate complications. Estimated blood loss:                            None. Estimated Blood Loss:     Estimated blood loss: none. Impression:               - One 5 mm polyp in the ascending colon, removed                            with a cold snare. Resected and retrieved.                           - Diverticulosis in the sigmoid colon. Rectosigmoid                            stenosis.                           - The examination was otherwise normal on direct                            and retroflexion views. Recommendation:           - Repeat colonoscopy in 5 or 10 years, based on                            final pathology, for surveillance (PEDIATRIC SCOPE).                           -  Patient has a contact number available for                            emergencies. The signs and symptoms of potential                            delayed complications were discussed with the                            patient. Return to normal activities tomorrow.                            Written discharge instructions were provided to the                            patient.                           - Resume previous diet.                           - Continue present medications.                           -  Await pathology results. Docia Chuck. Henrene Pastor, MD 09/02/2019 11:24:02 AM This report has been signed electronically.

## 2019-09-04 ENCOUNTER — Telehealth: Payer: Self-pay | Admitting: *Deleted

## 2019-09-04 ENCOUNTER — Encounter: Payer: Self-pay | Admitting: Internal Medicine

## 2019-09-04 NOTE — Telephone Encounter (Signed)
  Follow up Call-  Call back number 09/02/2019  Post procedure Call Back phone  # [phone]  Permission to leave phone message Yes  Some recent data might be hidden     Patient questions:  Do you have a fever, pain , or abdominal swelling? No. Pain Score  0 *  Have you tolerated food without any problems? Yes.    Have you been able to return to your normal activities? Yes.    Do you have any questions about your discharge instructions: Diet   No. Medications  No. Follow up visit  No.  Do you have questions or concerns about your Care? No.  Actions: * If pain score is 4 or above: 1. No action needed, pain <4.Have you developed a fever since your procedure? no  2.   Have you had an respiratory symptoms (SOB or cough) since your procedure? no  3.   Have you tested positive for COVID 19 since your procedure no  4.   Have you had any family members/close contacts diagnosed with the COVID 19 since your procedure?  no   If yes to any of these questions please route to Joylene John, RN and Erenest Rasher, RN

## 2021-10-07 ENCOUNTER — Institutional Professional Consult (permissible substitution): Payer: BC Managed Care – PPO | Admitting: Internal Medicine

## 2021-12-22 ENCOUNTER — Telehealth: Payer: Self-pay | Admitting: *Deleted

## 2022-01-04 ENCOUNTER — Ambulatory Visit: Payer: BC Managed Care – PPO | Admitting: Internal Medicine

## 2022-01-04 ENCOUNTER — Encounter: Payer: Self-pay | Admitting: Internal Medicine

## 2022-01-04 VITALS — BP 134/76 | HR 100 | Temp 98.0°F | Ht 62.0 in | Wt 177.6 lb

## 2022-01-04 DIAGNOSIS — Z8619 Personal history of other infectious and parasitic diseases: Secondary | ICD-10-CM

## 2022-01-04 DIAGNOSIS — R9389 Abnormal findings on diagnostic imaging of other specified body structures: Secondary | ICD-10-CM

## 2022-01-04 DIAGNOSIS — Z9225 Personal history of immunosupression therapy: Secondary | ICD-10-CM | POA: Diagnosis not present

## 2022-01-04 DIAGNOSIS — R0989 Other specified symptoms and signs involving the circulatory and respiratory systems: Secondary | ICD-10-CM

## 2022-01-04 DIAGNOSIS — Z8709 Personal history of other diseases of the respiratory system: Secondary | ICD-10-CM

## 2022-01-04 DIAGNOSIS — Z8739 Personal history of other diseases of the musculoskeletal system and connective tissue: Secondary | ICD-10-CM | POA: Diagnosis not present

## 2022-01-04 DIAGNOSIS — Z9889 Other specified postprocedural states: Secondary | ICD-10-CM

## 2022-01-04 NOTE — Progress Notes (Signed)
OV 01/04/2022  Subjective:  Patient ID: Sabrina Jacobson, female , DOB: Aug 12, 1965 , age 56 y.o. , MRN: [id number] , ADDRESS: [address] [place] [address] PCP Hennie Duos, MD Patient Care Team: Hennie Duos, MD as PCP - General (Rheumatology)  This Provider for this visit: Treatment Team:  Attending Provider: Tanda Rockers, MD    01/04/2022 -   Chief Complaint  Patient presents with   Consult    Pulmonary nodules on CT scan.     HPI Sabrina Jacobson 56 y.o. -referred by Dr. Leigh Aurora and his PA Leafy Kindle.  She is immunosuppressed because of rheumatoid arthritis.  She has had rheumatoid arthritis since 2011.  She used to be on Humira and Enbrel and at one point Simponi.  Currently for the last 1 year she is on start CERTOZULIMAB [another TNF alpha blocker].  She has been referred here for nodules but with an outside CT also suggesting possible subpleural reticular opacities in August 2023.  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  Then in the fall 2022 she suffered RSV and flu infections back to back.  At that point in time she had been on lisinopril as well.  All this resulted in epiglottitis and visit to the emergency department in [place].  She had a CT scan of the chest that apparently showed nodules but she was discharged from the ER but she very shortly after that had to be rushed back to another ER in [place], [place] and had have emergency cricothyrotomy in the emergency department.  After which she underwent tracheostomy.  She was then hospitalized in [place].  She does not recall any respiratory failure or ARDS.  She was had the ventilator only for 1 and half days but actually needed a month of hospitalization between the main hospital and a long-term acute care facility before coming back home.  Currently she is well she is returned back to teaching but  she does have a hoarse voice.  Also when she lies down and she lateral flexes her neck she could feel a choking sensation.  She is going to see a Environmental manager at the [facility] in [place], [place].    Currently she does not notice any undue shortness of breath while climbing stairs and from a pulmonary perspective she feels baseline.  However she had a follow-up CT scan of the chest without contrast at [facility] on November 08, 2021.  She brought the disc with her but we could not visualize the images because it would not run.  The report says the lung nodules resolved but she does have mild bronchiectasis?  Traction bronchitis].  There is also mild subpleural reticular opacities throughout all lobes no consolidations or effusions.  This raised the concern for interstitial lung disease.  Her uncle himself had scleroderma related pulmonary fibrosis and died in 2002-2004 she is very worried that she might have pulmonary fibrosis herself    CT Chest data 2013 abd CT: Lung Bases: Small hiatal hernia.  Otherwise, unremarkable.   Walking desaturation test is normal  PFT      No data to display         Simple office walk 185 feet x  3 laps goal with forehead probe 01/04/2022    O2 used ra   Number laps completed 3   Comments about pace avg  Resting Pulse Ox/HR 100% and 97/min   Final Pulse Ox/HR 100% and 124/min   Desaturated </= 88% no   Desaturated <= 3% points no   Got Tachycardic >/= 90/min yes   Symptoms at end of test none   Miscellaneous comments tachy        has a past medical history of Arthritis.   reports that she quit smoking about a year ago. Her smoking use included cigarettes. She started smoking about 37 years ago. She has never used smokeless tobacco.  Past Surgical History:  Procedure Laterality Date   CESAREAN SECTION     CHOLECYSTECTOMY     LUMBAR LAMINECTOMY/DECOMPRESSION MICRODISCECTOMY Left 10/21/2013   Procedure: LUMBAR TWO-THREE/FOUR  FIVELAMINECTOMY/DECOMPRESSION MICRODISCECTOMY 2 LEVELS;  Surgeon: Charlie Pitter, MD;  Location: [facility];  Service: Neurosurgery;  Laterality: Left;    Allergies  Allergen Reactions   Amoxicillin Hives and Other (See Comments)    Reaction unknown: Possible hives to either this medication or codeine   Biaxin [Clarithromycin] Other (See Comments)    Not sure of reaction, from administration in college   Ceclor [Cefaclor] Hives    Possible Reaction of hives    Coconut (Cocos Nucifera)    Codeine Nausea And Vomiting   Lisinopril Swelling     There is no immunization history on file for this patient.  Family History  Problem Relation Age of Onset   Colon polyps Mother    Colon cancer Neg Hx    Esophageal cancer Neg Hx    Rectal cancer Neg Hx    Stomach cancer Neg Hx      Current Outpatient Medications:    azelastine (ASTELIN) 0.1 % nasal spray, Place 1 spray into both nostrils daily. Use in each nostril as directed, Disp: , Rfl:    Calcium Carbonate-Vitamin D (CALCIUM-VITAMIN D3 PO), Take 600 mg by mouth 2 (two) times daily., Disp: , Rfl:    Certolizumab Pegol 2 X 200 MG/ML KIT, , Disp: , Rfl:    diclofenac (VOLTAREN) 75 MG EC tablet, Take 75 mg by mouth 2 (two) times daily as needed., Disp: , Rfl:    fluticasone (VERAMYST) 27.5 MCG/SPRAY nasal spray, Place 1 spray into the nose daily., Disp: , Rfl:    folic acid (FOLVITE) 1 MG tablet, Take 1 mg by mouth daily., Disp: , Rfl:    HYDROcodone-acetaminophen (NORCO/VICODIN) 5-325 MG tablet, , Disp: , Rfl:    Misc Natural Products (TURMERIC CURCUMIN) CAPS, Take 1 capsule by mouth daily., Disp: , Rfl:    methotrexate (RHEUMATREX) 2.5 MG tablet, Take 12.5 mg by mouth once a week. Caution:Chemotherapy. Protect from light. (Patient not taking: Reported on 01/04/2022), Disp: , Rfl:    predniSONE (DELTASONE) 5 MG tablet, Take 5 mg by mouth as needed.  (Patient not taking: Reported on 01/04/2022), Disp: , Rfl:       Objective:   Vitals:    01/04/22 1445  BP: 134/76  Pulse: 100  Temp: 98 F (36.7 C)  TempSrc: Oral  SpO2: 96%  Weight: 177 lb 9.6 oz (80.6 kg)  Height: 5' 2"  (1.575 m)    Estimated body mass index is 32.48 kg/m as calculated from the following:   Height as of this encounter: 5' 2"  (1.575 m).   Weight as of this encounter: 177 lb 9.6 oz (80.6 kg).  @WEIGHTCHANGE @  [employer]   01/04/22 1445  Weight: 177 lb 9.6 oz (80.6 kg)     Physical Exam    General: No  distress. Lokk well Neuro: Alert and Oriented x 3. GCS 15. Speech normal Psych: Pleasant Resp:  Barrel Chest - no.  Wheeze - no, Crackles - YE BASES, No overt respiratory distress CVS: Normal heart sounds. Murmurs - no Ext: Stigmata of Connective Tissue Disease - no HEENT: Normal upper airway. PEERL +. No post nasal drip.  Scar of prior tracheostomy present.  Has a hoarse voice        Assessment:       ICD-10-CM   1. Abnormal CT of the chest  R93.89 Pulmonary function test    2. Bibasilar crackles  R09.89     3. History of rheumatoid arthritis  Z87.39     4. Personal history of immunosupression therapy  Z92.25     5. History of RSV infection  Z86.19     6. History of influenza  Z87.09     7. History of acute epiglottitis  Z87.09     8. History of tracheostomy  Z98.890          Plan:     Patient Instructions     ICD-10-CM   1. Abnormal CT of the chest  R93.89     2. Bibasilar crackles  R09.89     3. History of rheumatoid arthritis  Z87.39     4. Personal history of immunosupression therapy  Z92.25     5. History of RSV infection  Z86.19     6. History of influenza  Z87.09     7. History of acute epiglottitis  Z87.09     8. History of tracheostomy  Z98.890      There might be some interstitial lung abnormalities based on the [facility] CT scan in August 2023 Unclear if there is something like your uncle  versus residual scarring from last years viral infection and tracheostomy  Plan  - Get full  pulmonary function test at [facility] in [place], [place] - wherever first available -Simple walk test on room air today -Discussed with primary care physician medication related approaches to weight loss such as Ozempic   Followup -Video visit in the next 8 weeks to discuss test results and to decide timing of repeat CT scan of the chest    SIGNATURE    Dr. Brand Males, M.D., F.C.C.P,  Pulmonary and Critical Care Medicine Staff Physician, [place] Director - Interstitial Lung Disease  Program  Pulmonary [place] at [place], [place], [address]  Pager: [phone], If no answer or between  15:00h - 7:00h: call [phone] Telephone: [phone]  5:07 PM 01/04/2022

## 2022-01-04 NOTE — Telephone Encounter (Signed)
Seems like encounter was open in error so closing encounter.  

## 2022-01-04 NOTE — Patient Instructions (Addendum)
ICD-10-CM   1. Abnormal CT of the chest  R93.89     2. Bibasilar crackles  R09.89     3. History of rheumatoid arthritis  Z87.39     4. Personal history of immunosupression therapy  Z92.25     5. History of RSV infection  Z86.19     6. History of influenza  Z87.09     7. History of acute epiglottitis  Z87.09     8. History of tracheostomy  Z98.890      There might be some interstitial lung abnormalities based on the [facility] CT scan in August 2023 Unclear if there is something like your uncle  versus residual scarring from last years viral infection and tracheostomy  Plan  - Get full pulmonary function test at [facility] in [place], [place] - wherever first available -Simple walk test on room air today -Discussed with primary care physician medication related approaches to weight loss such as Ozempic   Followup -Video visit in the next 8 weeks to discuss test results and to decide timing of repeat CT scan of the chest

## 2022-02-02 ENCOUNTER — Ambulatory Visit (INDEPENDENT_AMBULATORY_CARE_PROVIDER_SITE_OTHER): Payer: BC Managed Care – PPO | Admitting: Internal Medicine

## 2022-02-02 DIAGNOSIS — R9389 Abnormal findings on diagnostic imaging of other specified body structures: Secondary | ICD-10-CM | POA: Diagnosis not present

## 2022-02-02 LAB — PULMONARY FUNCTION TEST
DL/VA % pred: 122 %
DL/VA: 5.3 ml/min/mmHg/L
DLCO cor % pred: 98 %
DLCO cor: 18.82 ml/min/mmHg
DLCO unc % pred: 98 %
DLCO unc: 18.82 ml/min/mmHg
FEF 25-75 Post: 3.76 L/sec
FEF 25-75 Pre: 3.85 L/sec
FEF2575-%Change-Post: -2 %
FEF2575-%Pred-Post: 156 %
FEF2575-%Pred-Pre: 159 %
FEV1-%Change-Post: 0 %
FEV1-%Pred-Post: 97 %
FEV1-%Pred-Pre: 96 %
FEV1-Post: 2.4 L
FEV1-Pre: 2.39 L
FEV1FVC-%Change-Post: 1 %
FEV1FVC-%Pred-Pre: 112 %
FEV6-%Change-Post: -1 %
FEV6-%Pred-Post: 86 %
FEV6-%Pred-Pre: 87 %
FEV6-Post: 2.65 L
FEV6-Pre: 2.69 L
FEV6FVC-%Change-Post: 0 %
FEV6FVC-%Pred-Post: 103 %
FEV6FVC-%Pred-Pre: 103 %
FVC-%Change-Post: 0 %
FVC-%Pred-Post: 84 %
FVC-%Pred-Pre: 85 %
FVC-Post: 2.67 L
FVC-Pre: 2.7 L
Post FEV1/FVC ratio: 90 %
Post FEV6/FVC ratio: 100 %
Pre FEV1/FVC ratio: 89 %
Pre FEV6/FVC Ratio: 100 %
RV % pred: 17 %
RV: 0.32 L
TLC % pred: 108 %
TLC: 5.18 L

## 2022-02-02 NOTE — Progress Notes (Signed)
PFT done today. 

## 2022-02-15 ENCOUNTER — Encounter: Payer: Self-pay | Admitting: Internal Medicine

## 2022-02-15 ENCOUNTER — Telehealth: Payer: BC Managed Care – PPO | Admitting: Internal Medicine

## 2022-02-15 DIAGNOSIS — R0989 Other specified symptoms and signs involving the circulatory and respiratory systems: Secondary | ICD-10-CM | POA: Diagnosis not present

## 2022-02-15 DIAGNOSIS — R9389 Abnormal findings on diagnostic imaging of other specified body structures: Secondary | ICD-10-CM | POA: Diagnosis not present

## 2022-02-15 NOTE — Patient Instructions (Addendum)
ICD-10-CM   1. Abnormal CT of the chest  R93.89     2. Bibasilar crackles  R09.89     3. History of rheumatoid arthritis  Z87.39     4. Personal history of immunosupression therapy  Z92.25     5. History of RSV infection  Z86.19     6. History of influenza  Z87.09     7. History of acute epiglottitis  Z87.09     8. History of tracheostomy  Z98.890      There might be some interstitial lung abnormalities based on the [facility] CT scan in August 2023 At this point it is what we call ILA - an interstitial lung abnormality but not disease Very small fraction of these over the next few to several years will get worse into interstitial lung disease [ILD] Therefore the best plan is to continue to monitor this Currently breathing test is normal suggesting that this abnormality is not causing any impact for you at this moment in time  Plan - Do full pulmonary function test in 1 year [can be at [place] the doing respiratory viral infections particularly in a school environment; mask very diligently over the holidays with family and also in the school  -Surgical mask or KN95 recommended  Follow-up - Return for a face-to-face visit 15 minutes slot with Dr. Chase Caller in 1 year   -Symptoms score and Walking desaturation test at follow-up

## 2022-02-15 NOTE — Progress Notes (Signed)
OV 01/04/2022  Subjective:  Patient ID: Sabrina Jacobson, female , DOB: [birth year] , age 56 y.o. , MRN: [id number] , ADDRESS: [address] [place] [address] PCP Hennie Duos, MD Patient Care Team: Hennie Duos, MD as PCP - General (Rheumatology)  This Provider for this visit: Treatment Team:  Attending Provider: Tanda Rockers, MD    01/04/2022 -   Chief Complaint  Patient presents with   Consult    Pulmonary nodules on CT scan.     HPI Sabrina Jacobson 56 y.o. -referred by Dr. Leigh Aurora and his PA Leafy Kindle.  She is immunosuppressed because of rheumatoid arthritis.  She has had rheumatoid arthritis since 2011.  She used to be on Humira and Enbrel and at one point Simponi.  Currently for the last 1 year she is on start CERTOZULIMAB [another TNF alpha blocker].  She has been referred here for nodules but with an outside CT also suggesting possible subpleural reticular opacities in August 2023.  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  Then in the fall 2022 she suffered RSV and flu infections back to back.  At that point in time she had been on lisinopril as well.  All this resulted in epiglottitis and visit to the emergency department in [place].  She had a CT scan of the chest that apparently showed nodules but she was discharged from the ER but she very shortly after that had to be rushed back to another ER in [place], [place] and had have emergency cricothyrotomy in the emergency department.  After which she underwent tracheostomy.  She was then hospitalized in [place].  She does not recall any respiratory failure or ARDS.  She was had the ventilator only for 1 and half days but actually needed a month of hospitalization between the main hospital and a long-term acute care facility before coming back home.  Currently she is well she is returned back to teaching but  she does have a hoarse voice.  Also when she lies down and she lateral flexes her neck she could feel a choking sensation.  She is going to see a Environmental manager at the [facility] in [place], [place].    Currently she does not notice any undue shortness of breath while climbing stairs and from a pulmonary perspective she feels baseline.  However she had a follow-up CT scan of the chest without contrast at [facility] on November 08, 2021.  She brought the disc with her but we could not visualize the images because it would not run.  The report says the lung nodules resolved but she does have mild bronchiectasis?  Traction bronchitis].  There is also mild subpleural reticular opacities throughout all lobes no consolidations or effusions.  This raised the concern for interstitial lung disease.  Her uncle himself had scleroderma related pulmonary fibrosis and died in 2002-2004 she is very worried that she might have pulmonary fibrosis herself    CT Chest data 2013 abd CT: Lung Bases: Small hiatal hernia.  Otherwise, unremarkable.   Walking desaturation test is normal  PFT      No data to display            OV 02/15/2022  Subjective:  Patient ID: Sabrina Jacobson, female , DOB: December 30, 1965 , age 7 y.o. , MRN: [id number] , ADDRESS: [place] [address]  PCP Hennie Duos, MD Patient Care Team: Hennie Duos, MD as PCP - General (Rheumatology)  This Provider for this visit: Treatment Team:  Attending Provider: Brand Males, MD   Type of visit: Video Virtual Visit Identification of patient Sabrina Jacobson with Jun 12, 1965 and MRN [id number] - 2 person identifier Risks: Risks, benefits, limitations of telephone visit explained. Patient understood and verbalized agreement to proceed Anyone else on call: just patient Patient location: ? Her work This provider location: [address], [address]; [place]; [place] [address]. [facility].  [phone]     02/15/2022 -   Chief Complaint  Patient presents with   Follow-up     HPI Sabrina Jacobson 56 y.o. -has RA and immune suppressed. 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 Currently feeling well excpt post trach hoarseness. [employer] in [employer] development [employer]. Lot of sick kids Got sick herself but now back tpo baseline    Simple office walk 185 feet x  3 laps goal with forehead probe 01/04/2022    O2 used ra   Number laps completed 3   Comments about pace avg   Resting Pulse Ox/HR 100% and 97/min   Final Pulse Ox/HR 100% and 124/min   Desaturated </= 88% no   Desaturated <= 3% points no   Got Tachycardic >/= 90/min yes   Symptoms at end of test none   Miscellaneous comments tachy     PFT     Latest Ref Rng & Units 02/02/2022    3:31 PM  PFT Results  FVC-Pre L 2.70   FVC-Predicted Pre % 85   FVC-Post L 2.67   FVC-Predicted Post % 84   Pre FEV1/FVC % % 89   Post FEV1/FCV % % 90   FEV1-Pre L 2.39   FEV1-Predicted Pre % 96   FEV1-Post L 2.40   DLCO uncorrected ml/min/mmHg 18.82   DLCO UNC% % 98   DLCO corrected ml/min/mmHg 18.82   DLCO COR %Predicted % 98   DLVA Predicted % 122   TLC L 5.18   TLC % Predicted % 108   RV % Predicted % 17        has a past medical history of Arthritis.   reports that she quit smoking about 13 months ago. Her smoking use included cigarettes. She started smoking about 37 years ago. She has never used smokeless tobacco.  Past Surgical History:  Procedure Laterality Date   CESAREAN SECTION     CHOLECYSTECTOMY     LUMBAR LAMINECTOMY/DECOMPRESSION MICRODISCECTOMY Left 10/21/2013   Procedure: LUMBAR TWO-THREE/FOUR FIVELAMINECTOMY/DECOMPRESSION MICRODISCECTOMY 2 LEVELS;  Surgeon: Charlie Pitter, MD;  Location: [facility];  Service: Neurosurgery;  Laterality: Left;    Allergies  Allergen Reactions   Amoxicillin Hives and Other (See Comments)    Reaction  unknown: Possible hives to either this medication or codeine   Biaxin [Clarithromycin] Other (See Comments)    Not sure of reaction, from administration in college   Ceclor [Cefaclor] Hives    Possible Reaction of hives    Coconut (Cocos Nucifera)    Codeine Nausea And Vomiting   Lisinopril Swelling    Immunization History  Administered Date(s) Administered   Hepatitis A, Adult 09/02/2015   Hepatitis B, adult 09/02/2015, 10/07/2015   Influenza-Unspecified 12/18/2013, 01/07/2016, 01/14/2017, 12/18/2018   Tdap 09/02/2015   Typhoid Inactivated 09/03/2015    Family History  Problem Relation  Age of Onset   Colon polyps Mother    Colon cancer Neg Hx    Esophageal cancer Neg Hx    Rectal cancer Neg Hx    Stomach cancer Neg Hx      Current Outpatient Medications:    azelastine (ASTELIN) 0.1 % nasal spray, Place 1 spray into both nostrils daily. Use in each nostril as directed, Disp: , Rfl:    Calcium Carbonate-Vitamin D (CALCIUM-VITAMIN D3 PO), Take 600 mg by mouth 2 (two) times daily., Disp: , Rfl:    Certolizumab Pegol 2 X 200 MG/ML KIT, , Disp: , Rfl:    diclofenac Sodium (VOLTAREN) 1 % GEL, as directed Transdermal every 6 hours as needed, Disp: , Rfl:    fluticasone (VERAMYST) 27.5 MCG/SPRAY nasal spray, Place 1 spray into the nose daily., Disp: , Rfl:    folic acid (FOLVITE) 1 MG tablet, Take 1 mg by mouth daily., Disp: , Rfl:    methotrexate (RHEUMATREX) 2.5 MG tablet, Take 12.5 mg by mouth once a week. Caution:Chemotherapy. Protect from light., Disp: , Rfl:    omeprazole (PRILOSEC) 40 MG capsule, Take 40 mg by mouth daily., Disp: , Rfl:    diclofenac (VOLTAREN) 75 MG EC tablet, Take 75 mg by mouth 2 (two) times daily as needed. (Patient not taking: Reported on 02/15/2022), Disp: , Rfl:    HYDROcodone-acetaminophen (NORCO/VICODIN) 5-325 MG tablet, , Disp: , Rfl:       Objective:   There were no vitals filed for this visit.  Estimated body mass index is 32.48 kg/m as  calculated from the following:   Height as of 01/04/22: _0  (1.575 m).   Weight as of 01/04/22: 80.6 kg.  _1 @  There were no vitals filed for this visit.   Physical Exam General: No distress. Looks well Neuro: Alert and Oriented x 3. GCS 15. Speech normal Psych: Pleasant  HEENT: HOARSE VOICD        Assessment:     No diagnosis found.     Plan:     Patient Instructions     ICD-10-CM   1. Abnormal CT of the chest  R93.89     2. Bibasilar crackles  R09.89     3. History of rheumatoid arthritis  Z87.39     4. Personal history of immunosupression therapy  Z92.25     5. History of RSV infection  Z86.19     6. History of influenza  Z87.09     7. History of acute epiglottitis  Z87.09     8. History of tracheostomy  Z98.890      There might be some interstitial lung abnormalities based on the [facility] CT scan in August 2023 At this point it is what we call ILA - an interstitial lung abnormality but not disease Very small fraction of these over the next few to several years will get worse into interstitial lung disease [ILD] Therefore the best plan is to continue to monitor this Currently breathing test is normal suggesting that this abnormality is not causing any impact for you at this moment in time  Plan - Do full pulmonary function test in 1 year [can be at [place] the doing respiratory viral infections particularly in a school environment; mask very diligently over the holidays with family and also in the school  -Surgical mask or KN95 recommended  Follow-up - Return for a face-to-face visit 15 minutes slot with Dr. Chase Caller in 1 year   -Symptoms score and Walking  desaturation test at follow-up     ( Level 03: Esbt 20-29 min  spent in visit type: video virtual visit in total care time and counseling or/and coordination of care by this undersigned MD - Dr Brand Males. This includes one or more of the following all delivered on  this same day 02/15/2022: pre-charting, chart review, note writing, documentation discussion of test results, diagnostic or treatment recommendations, prognosis, risks and benefits of management options, instructions, education, compliance or risk-factor reduction. It excludes time spent by the [facility] or office staff in the care of the patient. Actual time was 20 min)   SIGNATURE    Dr. Brand Males, M.D., F.C.C.P,  Pulmonary and Critical Care Medicine Staff Physician, [place] Director - Interstitial Lung Disease  Program  Pulmonary [place] at [place], [place], [address]  Pager: [phone], If no answer or between  15:00h - 7:00h: call [phone] Telephone: [phone]  12:36 PM 02/15/2022

## 2022-02-15 NOTE — Progress Notes (Signed)
No flu vaccine for 2023 and no covid vaccines.

## 2022-02-21 ENCOUNTER — Telehealth: Payer: BC Managed Care – PPO | Admitting: Internal Medicine
